# Patient Record
Sex: Male | Born: 1955 | Race: White | Hispanic: No | Marital: Married | Smoking: Former smoker
Health system: Southern US, Community
[De-identification: ages and names within clinical notes are randomized; demographics above are authoritative.]

## PROBLEM LIST (undated history)

## (undated) DIAGNOSIS — C61 Malignant neoplasm of prostate: Secondary | ICD-10-CM

## (undated) HISTORY — PX: VASECTOMY: SHX75

## (undated) HISTORY — PX: PROSTATE BIOPSY: SHX241

---

## 1998-10-02 ENCOUNTER — Emergency Department (HOSPITAL_COMMUNITY): Admission: EM | Admit: 1998-10-02 | Discharge: 1998-10-02 | Payer: Self-pay | Admitting: Emergency Medicine

## 2009-06-16 ENCOUNTER — Ambulatory Visit: Payer: Self-pay | Admitting: Family Medicine

## 2009-06-16 DIAGNOSIS — N529 Male erectile dysfunction, unspecified: Secondary | ICD-10-CM | POA: Insufficient documentation

## 2009-06-16 DIAGNOSIS — R634 Abnormal weight loss: Secondary | ICD-10-CM | POA: Insufficient documentation

## 2009-06-16 LAB — CONVERTED CEMR LAB
Bilirubin Urine: NEGATIVE
Glucose, Urine, Semiquant: NEGATIVE
Specific Gravity, Urine: 1.015
WBC Urine, dipstick: NEGATIVE
pH: 7

## 2009-06-17 LAB — CONVERTED CEMR LAB
ALT: 26 units/L (ref 0–53)
AST: 27 units/L (ref 0–37)
Alkaline Phosphatase: 77 units/L (ref 39–117)
Basophils Relative: 0.5 % (ref 0.0–3.0)
Bilirubin, Direct: 0.2 mg/dL (ref 0.0–0.3)
Calcium: 9.1 mg/dL (ref 8.4–10.5)
Creatinine, Ser: 1.1 mg/dL (ref 0.4–1.5)
Eosinophils Absolute: 0.2 10*3/uL (ref 0.0–0.7)
Eosinophils Relative: 2.7 % (ref 0.0–5.0)
GFR calc non Af Amer: 78.07 mL/min (ref >60)
Lymphocytes Relative: 26.7 % (ref 12.0–46.0)
Monocytes Relative: 6.1 % (ref 3.0–12.0)
Neutrophils Relative %: 64 % (ref 43.0–77.0)
RBC: 4.79 M/uL (ref 4.22–5.81)
Sodium: 143 meq/L (ref 135–145)
Total Protein: 6.9 g/dL (ref 6.0–8.3)
WBC: 5.7 10*3/uL (ref 4.5–10.5)

## 2009-07-06 ENCOUNTER — Ambulatory Visit: Payer: Self-pay | Admitting: Family Medicine

## 2009-07-06 LAB — CONVERTED CEMR LAB: OCCULT 1: NEGATIVE

## 2009-07-22 ENCOUNTER — Ambulatory Visit: Payer: Self-pay | Admitting: Family Medicine

## 2009-10-21 ENCOUNTER — Ambulatory Visit: Payer: Self-pay | Admitting: Family Medicine

## 2009-10-24 ENCOUNTER — Telehealth: Payer: Self-pay | Admitting: Family Medicine

## 2009-10-24 LAB — CONVERTED CEMR LAB: PSA: 3.69 ng/mL (ref 0.10–4.00)

## 2010-01-31 NOTE — Assessment & Plan Note (Signed)
Summary: 1 month fup//ccm   Vital Signs:  Patient profile:   54 year old male Weight:      151 pounds Temp:     98 degrees F oral BP sitting:   120 / 80  (left arm) Cuff size:   regular  Vitals Entered By: Sid Falcon LPN (July 22, 2009 1:23 PM)  History of Present Illness: Recent weight loss. Labs were unremarkable with the exception of PSA 3.35 which is high for his age. His exam is nonfocal. He has gained about 6 pounds since last visit.  Patient brings in 3 day food diary since last visit. Significant caffeine consumption. Increase processed foods. Very few servings of fruits and vegetables. Low intake of calcium and dairy products. High processed sugar intake and high sodium foods. We did not calculate calorie consumption from his food diary yet.  He does appear to be getting adequate amount of calories but overall very poor food choices.  He is exercising more and overall feels well.  Allergies (verified): No Known Drug Allergies  Past History:  Past Medical History: Last updated: 06/16/2009 Chicken pox  Family History: Last updated: 06/16/2009 Family History of Alcoholism/Addiction, 2 brothers Father, heart disease, cholersterol problems hypertension 65s CAD Mother, arthritis, hypertension  Social History: Last updated: 06/16/2009 Occupation:  Art gallery manager Married Alcohol use-no Regular exercise-no Former Smoker quit 1986  Risk Factors: Exercise: no (06/16/2009)  Risk Factors: Smoking Status: quit (06/16/2009) PMH-FH-SH reviewed for relevance  Review of Systems  The patient denies anorexia, fever, chest pain, syncope, dyspnea on exertion, prolonged cough, headaches, hemoptysis, abdominal pain, melena, hematochezia, suspicious skin lesions, and enlarged lymph nodes.    Physical Exam  General:  Well-developed,well-nourished,in no acute distress; alert,appropriate and cooperative throughout examination Head:  Normocephalic and atraumatic without obvious  abnormalities. No apparent alopecia or balding. Ears:  External ear exam shows no significant lesions or deformities.  Otoscopic examination reveals clear canals, tympanic membranes are intact bilaterally without bulging, retraction, inflammation or discharge. Hearing is grossly normal bilaterally. Mouth:  Oral mucosa and oropharynx without lesions or exudates.  Teeth in good repair. Neck:  No deformities, masses, or tenderness noted. Lungs:  Normal respiratory effort, chest expands symmetrically. Lungs are clear to auscultation, no crackles or wheezes. Heart:  Normal rate and regular rhythm. S1 and S2 normal without gallop, murmur, click, rub or other extra sounds. Neurologic:  alert & oriented X3 and cranial nerves II-XII intact.   Skin:  no rashes and no suspicious lesions.   Cervical Nodes:  No lymphadenopathy noted   Impression & Recommendations:  Problem # 1:  WEIGHT LOSS (ICD-783.21) Assessment Improved  good weight gain since last visit. Does have high normal PSA and will recommend repeat in 3 months. Counseled regarding diet-recommend fewer processed foods, reduced sodium, and more fruits and vegetables.  Complete Medication List: 1)  Levitra 20 Mg Tabs (Vardenafil hcl) .... One by mouth once daily as needed  Patient Instructions: 1)  followup in 3 months for repeat PSA 2)  Eat fewer processed foods 3)  Eat more fruits and vegetables. 4)  Increase lean proteins-eggs, chicken, [place]. 5)  Increase non-meat proteins-peanut butter, beans, low fat dairy.

## 2010-01-31 NOTE — Progress Notes (Signed)
Summary: question about PSA  Phone Note Call from Patient Call back at Home Phone [phone]   Caller: Patient Call For: Evelena Peat MD Summary of Call: gave pt lab results and he wants to know why you want to wait .  Wants to know if he can repeat sooner or if you know what is going with PSA Initial call taken by: Alfred Levins, CMA,  October 24, 2009 3:24 PM  Follow-up for Phone Call        He is still within normal range, although high end of normal.  We have option of repeat in 6 months (any sooner is not very helpful). Will be happy to go ahead and refer to urology if he is interested-although they may elect to follow for now. Follow-up by: Evelena Peat MD,  October 25, 2009 5:51 AM  Additional Follow-up for Phone Call Additional follow up Details #1::        Pt will research, and let us know what he would like to do. Additional Follow-up by: Lynann Beaver CMA AAMA,  October 25, 2009 8:49 AM

## 2010-01-31 NOTE — Assessment & Plan Note (Signed)
Summary: NEW PT TO [facility]/TO EST/CJR   Vital Signs:  Patient profile:   55 year old male Height:      71.25 inches Weight:      144.5 pounds BMI:     20.08 Temp:     98.4 degrees F oral Pulse rate:   60 / minute Pulse rhythm:   regular Resp:     12 per minute BP sitting:   120 / 80  (left arm) Cuff size:   regular  Vitals Entered By: Sid Falcon LPN (June 16, 2009 10:27 AM) CC: New to establish   History of Present Illness: New to establish care.  PMH reviewed and no chronic med problems.  No meds.  NKDA. Occ problems with ED and takes Levitra for that.  major issue is weight loss of about 10 pounds over the past year.  Eats 5-6 times per day with good appetite.  Denies headaches, fever, dysphagia, abd pain , dysuria, rashes, cough, dyspnea, change in stool habits, diarrhea, or depressive symptoms.  patient denies prior history of screening colonoscopy. 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 Worsened with fine motor activity. No progressive symptoms.   Preventive Screening-Counseling & Management  Alcohol-Tobacco     Smoking Status: quit     Year Started: 1976     Year Quit: 1986  Caffeine-Diet-Exercise     Does Patient Exercise: no  Past History:  Family History: Last updated: 06/16/2009 Family History of Alcoholism/Addiction, 2 brothers Father, heart disease, cholersterol problems hypertension 27s CAD Mother, arthritis, hypertension  Social History: Last updated: 06/16/2009 Occupation:  Art gallery manager Married Alcohol use-no Regular exercise-no Former Smoker quit 1986  Risk Factors: Exercise: no (06/16/2009)  Risk Factors: Smoking Status: quit (06/16/2009)  Past Medical History: Chicken pox PMH-FH-SH reviewed for relevance  Family History: Family History of Alcoholism/Addiction, 2 brothers Father, heart disease, cholersterol problems hypertension 38s CAD Mother, arthritis, hypertension  Social History: Occupation:   Art gallery manager Married Alcohol use-no Regular exercise-no Former Smoker quit 1986 Smoking Status:  quit Occupation:  employed Does Patient Exercise:  no  Review of Systems       The patient complains of weight loss.  The patient denies anorexia, fever, chest pain, syncope, dyspnea on exertion, peripheral edema, prolonged cough, headaches, hemoptysis, abdominal pain, melena, hematochezia, severe indigestion/heartburn, hematuria, incontinence, genital sores, muscle weakness, suspicious skin lesions, depression, enlarged lymph nodes, and testicular masses.    Physical Exam  General:  Well-developed,well-nourished,in no acute distress; alert,appropriate and cooperative throughout examination Head:  Normocephalic and atraumatic without obvious abnormalities. No apparent alopecia or balding. Eyes:  No corneal or conjunctival inflammation noted. EOMI. Perrla. Funduscopic exam benign, without hemorrhages, exudates or papilledema. Vision grossly normal. Ears:  External ear exam shows no significant lesions or deformities.  Otoscopic examination reveals clear canals, tympanic membranes are intact bilaterally without bulging, retraction, inflammation or discharge. Hearing is grossly normal bilaterally. Mouth:  Oral mucosa and oropharynx without lesions or exudates.  Teeth in good repair. Neck:  No deformities, masses, or tenderness noted. Lungs:  Normal respiratory effort, chest expands symmetrically. Lungs are clear to auscultation, no crackles or wheezes. Heart:  Normal rate and regular rhythm. S1 and S2 normal without gallop, murmur, click, rub or other extra sounds. Abdomen:  Bowel sounds positive,abdomen soft and non-tender without masses, organomegaly or hernias noted. Rectal:  No external abnormalities noted. Normal sphincter tone. No rectal masses or tenderness. Prostate:  Prostate gland firm and smooth, no enlargement, nodularity, tenderness, mass, asymmetry or induration. Msk:  No deformity or  scoliosis noted of thoracic or lumbar spine.   Extremities:  No clubbing, cyanosis, edema, or deformity noted with normal full range of motion of all joints.   Neurologic:  alert & oriented X3 and cranial nerves II-XII intact.   Skin:  no rashes and no suspicious lesions.   Cervical Nodes:  No lymphadenopathy noted Psych:  Oriented X3, good eye contact, not anxious appearing, and not depressed appearing.     Impression & Recommendations:  Problem # 1:  WEIGHT LOSS (ICD-783.21) ?etiology.  needs screening labs and close follow up one month.  no evidence for depression.  If labs normal and weight loss progresses, consider CXR and ?abd imaging at follow up. 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 One by mouth once daily as needed  Complete Medication List: 1)  Levitra 20 Mg Tabs (Vardenafil hcl) .... One by mouth once daily as needed  Patient Instructions: 1)  Please schedule a follow-up appointment in 1 month.   Preventive Care Screening  Last Tetanus Booster:    Date:  01/02/2003    Results:  Historical    Laboratory Results   Urine Tests  Date/Time Recieved: June 16, 2009 12:01 PM  Date/Time Reported: June 16, 2009 12:00 PM   Routine Urinalysis   Color: yellow Appearance: Clear Glucose: negative   (Normal Range: Negative) Bilirubin: negative   (Normal Range: Negative) Ketone: negative   (Normal Range: Negative) Spec. Gravity: 1.015   (Normal Range: 1.003-1.035) Blood: negative   (Normal Range: Negative) pH: 7.0   (Normal Range:  5.0-8.0) Protein: negative   (Normal Range: Negative) Urobilinogen: 0.2   (Normal Range: 0-1) Nitrite: negative   (Normal Range: Negative) Leukocyte Esterace: negative   (Normal Range: Negative)    Comments: Wynona Canes, CMA  June 16, 2009 12:01 PM

## 2010-04-27 ENCOUNTER — Ambulatory Visit (INDEPENDENT_AMBULATORY_CARE_PROVIDER_SITE_OTHER): Payer: BC Managed Care – PPO | Admitting: Family Medicine

## 2010-04-27 ENCOUNTER — Encounter: Payer: Self-pay | Admitting: Family Medicine

## 2010-04-27 VITALS — BP 104/66 | HR 98 | Temp 100.0°F | Ht 72.0 in | Wt 148.0 lb

## 2010-04-27 DIAGNOSIS — R509 Fever, unspecified: Secondary | ICD-10-CM

## 2010-04-27 NOTE — Patient Instructions (Signed)
Continue with Ibuprofen for fever and plenty of fluids. Follow up promptly for any worsening symptoms.

## 2010-04-27 NOTE — Progress Notes (Signed)
  Subjective:    Patient ID: Isaac Ramsey, male    DOB: 17-Jun-1955, 55 y.o.   MRN: [id number]  HPI Patient seen with low-grade fever, headaches, and chills. Onset this morning. Recent history is that he states he had gum abscess about 2 weeks ago. Was treated with Avelox and eventually symptoms improved. Finished antibiotic last week. Had one loose stool today. No bloody stools. No abnormal cramps. He's had some bifrontal headache and some sinus pressure. Mild sore throat. Diffuse body aches. Denies any cough. No recent tick bite. No localizing abdominal pain. No dysuria. Body aches and fever improved with ibuprofen   Review of Systems As per history of present illness    Objective:   Physical Exam  Constitutional: He appears well-developed and well-nourished. No distress.  HENT:  Right Ear: External ear normal.  Left Ear: External ear normal.  Mouth/Throat: Oropharynx is clear and moist. No oropharyngeal exudate.  Eyes: Pupils are equal, round, and reactive to light.  Neck: Normal range of motion. Neck supple.  Cardiovascular: Normal rate, regular rhythm and normal heart sounds.   No murmur heard. Pulmonary/Chest: Effort normal and breath sounds normal. No respiratory distress. He has no wheezes. He has no rales.  Abdominal: Soft. Bowel sounds are normal. He exhibits no distension and no mass. There is no tenderness. There is no rebound and no guarding.  Musculoskeletal: He exhibits no edema.  Lymphadenopathy:    He has no cervical adenopathy.  Skin: No rash noted.          Assessment & Plan:  Febrile illness. Suspect viral. Continue ibuprofen including fluids. Follow up promptly for increasing fever or worsening symptoms

## 2010-04-28 ENCOUNTER — Telehealth: Payer: Self-pay

## 2010-04-28 NOTE — Telephone Encounter (Signed)
Pt called and stated that he was seen on 04/27/2010 and he got worse over the night; still has a headache, experiencing bad diarrhea and stomach cramping; would like an rx for diarrhea because he cannot keep any fluids down---please advise

## 2010-04-28 NOTE — Telephone Encounter (Signed)
Try Imodium which is over the counter.

## 2010-04-28 NOTE — Telephone Encounter (Signed)
Spoke with pt; he is aware that Immodium is best for diarrhea at this point and Tylenol for his headache

## 2010-05-12 ENCOUNTER — Ambulatory Visit (INDEPENDENT_AMBULATORY_CARE_PROVIDER_SITE_OTHER): Payer: BC Managed Care – PPO | Admitting: Family Medicine

## 2010-05-12 ENCOUNTER — Encounter: Payer: Self-pay | Admitting: Family Medicine

## 2010-05-12 VITALS — BP 110/74 | Temp 98.4°F | Wt 144.0 lb

## 2010-05-12 DIAGNOSIS — R42 Dizziness and giddiness: Secondary | ICD-10-CM

## 2010-05-12 DIAGNOSIS — R634 Abnormal weight loss: Secondary | ICD-10-CM

## 2010-05-12 NOTE — Progress Notes (Signed)
  Subjective:    Patient ID: Isaac Ramsey, male    DOB: 12-Jul-1955, 55 y.o.   MRN: [id number]  HPI Patient same of dizziness which started about a week ago. Recent probable viral illness with gastrointestinal symptoms and those have finally resolved. He has not had any further diarrhea this time. He complains of some weight loss and weight is down 4 pounds though comparing with weight one year ago was approximate same weight is today. Generally his appetite is okay.  Dizziness sounds more like vertigo. Worse with movement. No hearing changes. No ringing in the ears. Denies any headaches. No fever or chills. No syncopal or presyncopal type symptoms. No orthostatic symptoms. Denies focal weakness. No problems with coordination.  He takes no medications. Hx smoker. Denies any cough or shortness of breath. No stool changes. No dysuria. No skin rashes. Denies abdominal pain.  Did have high normal PSA several months ago and needs repeat.   Review of Systems  Constitutional: Positive for unexpected weight change. Negative for fever, chills, appetite change and fatigue.  HENT: Negative for hearing loss, ear pain, sore throat, trouble swallowing, voice change, sinus pressure, tinnitus and ear discharge.   Respiratory: Negative for cough, shortness of breath and wheezing.   Cardiovascular: Negative for chest pain, palpitations and leg swelling.  Gastrointestinal: Negative for nausea, vomiting, abdominal pain, diarrhea, constipation and blood in stool.  Genitourinary: Negative for dysuria and hematuria.  Neurological: Positive for dizziness. Negative for seizures, syncope, facial asymmetry, weakness and headaches.  Hematological: Negative for adenopathy. Does not bruise/bleed easily.  Psychiatric/Behavioral: Negative for dysphoric mood.       Objective:   Physical Exam  Constitutional: He is oriented to person, place, and time. He appears well-developed and well-nourished.  HENT:  Head:  Normocephalic.  Right Ear: External ear normal.  Left Ear: External ear normal.  Mouth/Throat: Oropharynx is clear and moist. No oropharyngeal exudate.  Eyes: Pupils are equal, round, and reactive to light.  Neck: Neck supple. No thyromegaly present.  Cardiovascular: Normal rate, regular rhythm and normal heart sounds.   No murmur heard. Pulmonary/Chest: Effort normal and breath sounds normal. No respiratory distress. He has no wheezes. He has no rales.  Musculoskeletal: He exhibits no edema.  Lymphadenopathy:    He has no cervical adenopathy.  Neurological: He is alert and oriented to person, place, and time. He has normal reflexes. No cranial nerve deficit.       Cerebellar function is normal. No focal strength deficits  Skin: No rash noted.  Psychiatric: He has a normal mood and affect.          Assessment & Plan:  #1 dizziness. This sounds more like vertigo. Suspect benign positional vertigo. Nonfocal exam at this time. Reassurance and observation. #2 weight loss which is relatively mild. Recheck labs including TSH, PSA, CBC, basic metabolic panel, and hepatic panel. #3 history of high normal PSA. Repeat today and if still elevating urology referral

## 2010-05-12 NOTE — Patient Instructions (Signed)
Benign Positional Vertigo (BPV)  Vertigo is a feeling that you are unsteady or that you or your surroundings are moving. Benign positional vertigo (BPV) is the most common form of vertigo. Benign means it does not have a serious cause. It is an upset in the balance system in your middle ear. This is troublesome but usually not serious. A viral infection or head injury are common causes, but often no cause is found. It is more common as we grow older.  SYMPTOMS  Sudden dizziness happens when you move your head in different directions. Some of the problems that come with this are:   Loss of balance    Throwing up      Blurred vision     Dizziness      Feeling sick to your stomach      DIAGNOSIS  Your caregiver may do some specialized testing to prove what is wrong.  HOME CARE INSTRUCTIONS   Rest and eat a well balanced diet.    Move slowly and do not make sudden body or head movements.    Do not drive a car or do any activities that could hurt you or others.    Lie down and rest. Take precautions to prevent falls.   SEEK IMMEDIATE MEDICAL CARE IF:   You develop headaches which are severe or lasting.    You develop continued vomiting.    A temporary loss or change of vision appears.    You notice temporary numbness on one side of your body.    You are temporarily unable to speak.    Temporary areas of weakness develop.    You have weakness or numbness in the face, arms, or legs.    You notice dizziness or difficulty walking.    You experience slurred speech or difficulty swallowing.   MAKE SURE YOU:     Understand these instructions.    Will watch your condition.    Will get help right away if you are not doing well or get worse.   Document Released: 09/25/2005 Document Re-Released: 04/05/2008  [employer] Patient Information 2011 [employer].

## 2010-05-13 LAB — BASIC METABOLIC PANEL
CO2: 30 mEq/L (ref 19–32)
Calcium: 8.4 mg/dL (ref 8.4–10.5)
Sodium: 142 mEq/L (ref 135–145)

## 2010-05-13 LAB — CBC WITH DIFFERENTIAL/PLATELET
Basophils Relative: 1 % (ref 0–1)
Eosinophils Absolute: 0.1 10*3/uL (ref 0.0–0.7)
HCT: 38.7 % — ABNORMAL LOW (ref 39.0–52.0)
Hemoglobin: 12.8 g/dL — ABNORMAL LOW (ref 13.0–17.0)
MCH: 28.6 pg (ref 26.0–34.0)
MCHC: 33.1 g/dL (ref 30.0–36.0)
Monocytes Absolute: 0.5 10*3/uL (ref 0.1–1.0)
Monocytes Relative: 10 % (ref 3–12)
Neutrophils Relative %: 43 % (ref 43–77)

## 2010-05-13 LAB — HEPATIC FUNCTION PANEL
ALT: 25 U/L (ref 0–53)
AST: 25 U/L (ref 0–37)
Albumin: 3.5 g/dL (ref 3.5–5.2)
Total Protein: 6.5 g/dL (ref 6.0–8.3)

## 2010-05-13 LAB — PSA: PSA: 2.36 ng/mL (ref <=4.00)

## 2010-05-16 NOTE — Progress Notes (Signed)
Quick Note:  On VM explained to pt labs and instructions will be in the mail for him to review and call back to schedule ______

## 2010-05-23 ENCOUNTER — Other Ambulatory Visit (INDEPENDENT_AMBULATORY_CARE_PROVIDER_SITE_OTHER): Payer: BC Managed Care – PPO | Admitting: Family Medicine

## 2010-05-23 DIAGNOSIS — D509 Iron deficiency anemia, unspecified: Secondary | ICD-10-CM

## 2010-05-23 DIAGNOSIS — E611 Iron deficiency: Secondary | ICD-10-CM

## 2010-05-24 NOTE — Progress Notes (Signed)
Quick Note:  Pt informed ______ 

## 2010-08-07 ENCOUNTER — Other Ambulatory Visit: Payer: BC Managed Care – PPO

## 2010-08-07 DIAGNOSIS — K921 Melena: Secondary | ICD-10-CM

## 2010-08-07 DIAGNOSIS — Z0000 Encounter for general adult medical examination without abnormal findings: Secondary | ICD-10-CM

## 2010-08-07 LAB — HEMOCCULT GUIAC POC 1CARD (OFFICE): Card #3 Fecal Occult Blood, POC: NEGATIVE

## 2010-08-09 NOTE — Progress Notes (Signed)
Quick Note:  Pt informed ______ 

## 2011-12-27 ENCOUNTER — Encounter: Payer: Self-pay | Admitting: Family Medicine

## 2011-12-27 ENCOUNTER — Other Ambulatory Visit: Payer: Self-pay | Admitting: Family Medicine

## 2011-12-27 ENCOUNTER — Ambulatory Visit (INDEPENDENT_AMBULATORY_CARE_PROVIDER_SITE_OTHER): Payer: BC Managed Care – PPO | Admitting: Family Medicine

## 2011-12-27 VITALS — BP 110/80 | Temp 97.9°F | Wt 150.0 lb

## 2011-12-27 DIAGNOSIS — B351 Tinea unguium: Secondary | ICD-10-CM

## 2011-12-27 LAB — HEPATIC FUNCTION PANEL
Bilirubin, Direct: 0.2 mg/dL (ref 0.0–0.3)
Total Bilirubin: 2.2 mg/dL — ABNORMAL HIGH (ref 0.3–1.2)

## 2011-12-27 MED ORDER — TERBINAFINE HCL 250 MG PO TABS: 250.0000 mg | ORAL_TABLET | Freq: Every day | ORAL | Status: DC

## 2011-12-27 NOTE — Progress Notes (Signed)
  Subjective:    Patient ID: Isaac Ramsey, male    DOB: 1955-08-21, 56 y.o.   MRN: [id number]  HPI  Dysmorphic left great toenail. Patient had some brittle changes in color changes for several months. No history of trauma. Nonpainful. No history of diabetes. He is interested in treatment options. No alleviating factors. No history of any liver problems. Appetite and weight have been stable   Review of Systems  Constitutional: Negative for fever, chills, appetite change and unexpected weight change.  Gastrointestinal: Negative for abdominal pain.       Objective:   Physical Exam  Constitutional: He appears well-developed and well-nourished.  Cardiovascular: Normal rate and regular rhythm.   Pulmonary/Chest: Effort normal and breath sounds normal. No respiratory distress. He has no wheezes. He has no rales.  Skin:          Left great toenail reveals some brittle changes and a whitish discoloration along the base and the lateral border of the toenail. No paronychia. No cellulitis changes. Nontender          Assessment & Plan:  Probable onychomycosis left great toenail. Nail scraping /clippings will be sent for fungal culture. Hepatic panel drawn. Lamisil 250 mg once daily for 3 months

## 2011-12-27 NOTE — Patient Instructions (Addendum)
Ringworm, Nail  A fungal infection of the nail (tinea unguium/onychomycosis) is common. It is common as the visible part of the nail is composed of dead cells which have no blood supply to help prevent infection. It occurs because fungi are everywhere and will pick any opportunity to grow on any dead material.  Because nails are very slow growing they require up to 2 years of treatment with anti-fungal medications. The entire nail back to the base is infected. This includes approximately  of the nail which you cannot see.  If your caregiver has prescribed a medication by mouth, take it every day and as directed. No progress will be seen for at least 6 to 9 months. Do not be disappointed! Because fungi live on dead cells with little or no exposure to blood supply, medication delivery to the infection is slow; thus the cure is slow. It is also why you can observe no progress in the first 6 months. The nail becoming cured is the base of the nail, as it has the blood supply. Topical medication such as creams and ointments are usually not effective. Important in successful treatment of nail fungus is closely following the medication regimen that your doctor prescribes.  Sometimes you and your caregiver may elect to speed up this process by surgical removal of all the nails. Even this may still require 6 to 9 months of additional oral medications.  See your caregiver as directed. Remember there will be no visible improvement for at least 6 months. See your caregiver sooner if other signs of infection (redness and swelling) develop.  Document Released: 12/16/1999 Document Revised: 03/12/2011 Document Reviewed: 02/24/2008  [employer] Patient Information 2013 [employer].

## 2011-12-28 ENCOUNTER — Other Ambulatory Visit: Payer: Self-pay | Admitting: *Deleted

## 2011-12-28 LAB — KOH PREP

## 2012-01-15 NOTE — Progress Notes (Signed)
Quick Note:  Pt informed on VM ______ 

## 2012-01-25 LAB — CULTURE, FUNGUS WITHOUT SMEAR

## 2012-07-18 ENCOUNTER — Other Ambulatory Visit (INDEPENDENT_AMBULATORY_CARE_PROVIDER_SITE_OTHER): Payer: BC Managed Care – PPO

## 2012-07-18 DIAGNOSIS — Z0000 Encounter for general adult medical examination without abnormal findings: Secondary | ICD-10-CM

## 2012-07-18 DIAGNOSIS — R7989 Other specified abnormal findings of blood chemistry: Secondary | ICD-10-CM

## 2012-07-18 LAB — LDL CHOLESTEROL, DIRECT: Direct LDL: 123.2 mg/dL

## 2012-07-18 LAB — POCT URINALYSIS DIPSTICK
Bilirubin, UA: NEGATIVE
Blood, UA: NEGATIVE
Glucose, UA: NEGATIVE
Ketones, UA: NEGATIVE
Leukocytes, UA: NEGATIVE
Nitrite, UA: NEGATIVE
Protein, UA: NEGATIVE
Spec Grav, UA: 1.02
Urobilinogen, UA: 0.2
pH, UA: 8.5

## 2012-07-18 LAB — BASIC METABOLIC PANEL
BUN: 10 mg/dL (ref 6–23)
Chloride: 101 mEq/L (ref 96–112)
Glucose, Bld: 88 mg/dL (ref 70–99)
Potassium: 4.4 mEq/L (ref 3.5–5.1)

## 2012-07-18 LAB — LIPID PANEL
Cholesterol: 202 mg/dL — ABNORMAL HIGH (ref 0–200)
HDL: 54.9 mg/dL (ref >39.00)
Total CHOL/HDL Ratio: 4
Triglycerides: 100 mg/dL (ref 0.0–149.0)
VLDL: 20 mg/dL (ref 0.0–40.0)

## 2012-07-18 LAB — CBC WITH DIFFERENTIAL/PLATELET
Basophils Relative: 0.6 % (ref 0.0–3.0)
Eosinophils Relative: 4.7 % (ref 0.0–5.0)
Hemoglobin: 15 g/dL (ref 13.0–17.0)
Lymphocytes Relative: 31.2 % (ref 12.0–46.0)
MCV: 90.7 fl (ref 78.0–100.0)
Neutrophils Relative %: 56.2 % (ref 43.0–77.0)
RBC: 4.88 Mil/uL (ref 4.22–5.81)
WBC: 4.9 10*3/uL (ref 4.5–10.5)

## 2012-07-18 LAB — HEPATIC FUNCTION PANEL
ALT: 23 U/L (ref 0–53)
Total Bilirubin: 2 mg/dL — ABNORMAL HIGH (ref 0.3–1.2)
Total Protein: 6.7 g/dL (ref 6.0–8.3)

## 2012-07-25 ENCOUNTER — Ambulatory Visit (INDEPENDENT_AMBULATORY_CARE_PROVIDER_SITE_OTHER): Payer: BC Managed Care – PPO | Admitting: Family Medicine

## 2012-07-25 ENCOUNTER — Encounter: Payer: Self-pay | Admitting: Family Medicine

## 2012-07-25 VITALS — BP 110/74 | HR 81 | Temp 98.2°F | Ht 72.0 in | Wt 150.0 lb

## 2012-07-25 DIAGNOSIS — Z0000 Encounter for general adult medical examination without abnormal findings: Secondary | ICD-10-CM

## 2012-07-25 DIAGNOSIS — G250 Essential tremor: Secondary | ICD-10-CM

## 2012-07-25 DIAGNOSIS — G252 Other specified forms of tremor: Secondary | ICD-10-CM

## 2012-07-25 DIAGNOSIS — R972 Elevated prostate specific antigen [PSA]: Secondary | ICD-10-CM

## 2012-07-25 NOTE — Progress Notes (Signed)
Subjective:    Patient ID: Isaac Ramsey, male    DOB: September 11, 1955, 57 y.o.   MRN: [id number]  HPI Patient here for complete physical. Generally very healthy. He has history of essential tremor which is minimally bothersome and is not interested in medication this time. He has never had screening colonoscopy and is not willing to go at this time. He does agree to Hemoccult cards. Last tetanus 2005. He walks some for exercise but no consistent exercise. Takes no medications. No prior surgeries.  Patient quit smoking several years ago. Family history as below  Patient has history of essential tremor. Symptoms are relatively mild. Worse with activity. Not extinguished with activities. He has not had any new findings and minimal if any progression since last visit. He is not interested in medication treatment options at this time. He has tried to minimize his caffeine use. Caffeine seems to exacerbate  Recent very transient vertigo episode. He had 2 episodes over . Both occurrences were with turning head to the left rapidly. He had no headaches, visual changes, speech changes, ataxia, or a other focal neurologic symptoms. No syncopal or presyncopal symptoms  Past Medical History  Diagnosis Date  . Chickenpox    No past surgical history on file.  reports that he quit smoking about 28 years ago. He does not have any smokeless tobacco history on file. He reports that he does not drink alcohol. His drug history is not on file. family history includes Alcohol abuse in his brother; Arthritis in his mother; Heart disease (age of onset: 62) in his father; Hyperlipidemia in his father; and Hypertension in his father and mother. No Known Allergies    Review of Systems  Constitutional: Negative for fever, chills, activity change, appetite change and fatigue.  HENT: Negative for ear pain, congestion and trouble swallowing.   Eyes: Negative for pain and visual disturbance.  Respiratory: Negative for  cough, shortness of breath and wheezing.   Cardiovascular: Negative for chest pain and palpitations.  Gastrointestinal: Negative for nausea, vomiting, abdominal pain, diarrhea, constipation, blood in stool, abdominal distention and rectal pain.  Genitourinary: Negative for dysuria, hematuria and testicular pain.  Musculoskeletal: Negative for joint swelling and arthralgias.  Skin: Negative for rash.  Neurological: Negative for dizziness, syncope and headaches.  Hematological: Negative for adenopathy.  Psychiatric/Behavioral: Negative for confusion and dysphoric mood.       Objective:   Physical Exam  Constitutional: He is oriented to person, place, and time. He appears well-developed and well-nourished. No distress.  HENT:  Head: Normocephalic and atraumatic.  Right Ear: External ear normal.  Left Ear: External ear normal.  Mouth/Throat: Oropharynx is clear and moist.  Eyes: Conjunctivae and EOM are normal. Pupils are equal, round, and reactive to light.  Neck: Normal range of motion. Neck supple. No thyromegaly present.  Cardiovascular: Normal rate, regular rhythm and normal heart sounds.   No murmur heard. Pulmonary/Chest: No respiratory distress. He has no wheezes. He has no rales.  Abdominal: Soft. Bowel sounds are normal. He exhibits no distension and no mass. There is no tenderness. There is no rebound and no guarding.  Genitourinary: Rectum normal and prostate normal.  Musculoskeletal: He exhibits no edema.  Lymphadenopathy:    He has no cervical adenopathy.  Neurological: He is alert and oriented to person, place, and time. He displays normal reflexes. No cranial nerve deficit.  Skin: No rash noted.  Psychiatric: He has a normal mood and affect.  Assessment & Plan:  Complete physical. Patient has elevated PSA 5.36. Set of urology referral. Hemoccult cards given. he declines colonoscopy at this time.   We'll need tetanus booster next year  Essential tremor. We  discussed possible medication options but at this point he does not wish to pursue those.  Very transient vertigo recently. Symptoms resolved at this time. Suspect benign positional vertigo. Reassurance.

## 2012-07-25 NOTE — Patient Instructions (Addendum)
We will call you with Urology appointment. 

## 2013-02-02 ENCOUNTER — Other Ambulatory Visit (INDEPENDENT_AMBULATORY_CARE_PROVIDER_SITE_OTHER): Payer: BC Managed Care – PPO

## 2013-02-02 ENCOUNTER — Telehealth: Payer: Self-pay

## 2013-02-02 DIAGNOSIS — R972 Elevated prostate specific antigen [PSA]: Secondary | ICD-10-CM

## 2013-02-02 LAB — PSA: PSA: 7.6 ng/mL — AB (ref 0.10–4.00)

## 2013-02-02 NOTE — Telephone Encounter (Signed)
Pt wants to know if he can get his PSA checked

## 2013-02-02 NOTE — Telephone Encounter (Signed)
Order is placed.

## 2013-02-02 NOTE — Telephone Encounter (Signed)
Yes OK to order 

## 2013-09-02 ENCOUNTER — Other Ambulatory Visit (INDEPENDENT_AMBULATORY_CARE_PROVIDER_SITE_OTHER): Payer: BC Managed Care – PPO

## 2013-09-02 DIAGNOSIS — Z0000 Encounter for general adult medical examination without abnormal findings: Secondary | ICD-10-CM

## 2013-09-02 LAB — POCT URINALYSIS DIPSTICK
BILIRUBIN UA: NEGATIVE
Blood, UA: NEGATIVE
GLUCOSE UA: NEGATIVE
KETONES UA: NEGATIVE
Leukocytes, UA: NEGATIVE
Nitrite, UA: NEGATIVE
Protein, UA: NEGATIVE
SPEC GRAV UA: 1.01
Urobilinogen, UA: 0.2
pH, UA: 7

## 2013-09-02 LAB — CBC WITH DIFFERENTIAL/PLATELET
Basophils Absolute: 0 10*3/uL (ref 0.0–0.1)
Basophils Relative: 0.6 % (ref 0.0–3.0)
Eosinophils Absolute: 0.2 10*3/uL (ref 0.0–0.7)
Eosinophils Relative: 3.5 % (ref 0.0–5.0)
HCT: 43.7 % (ref 39.0–52.0)
HEMOGLOBIN: 14.8 g/dL (ref 13.0–17.0)
Lymphocytes Relative: 32.6 % (ref 12.0–46.0)
Lymphs Abs: 1.5 10*3/uL (ref 0.7–4.0)
MCHC: 34 g/dL (ref 30.0–36.0)
MCV: 89.7 fl (ref 78.0–100.0)
Monocytes Absolute: 0.3 10*3/uL (ref 0.1–1.0)
Monocytes Relative: 7.6 % (ref 3.0–12.0)
NEUTROS ABS: 2.5 10*3/uL (ref 1.4–7.7)
Neutrophils Relative %: 55.7 % (ref 43.0–77.0)
Platelets: 209 10*3/uL (ref 150.0–400.0)
RBC: 4.87 Mil/uL (ref 4.22–5.81)
RDW: 12.7 % (ref 11.5–15.5)
WBC: 4.5 10*3/uL (ref 4.0–10.5)

## 2013-09-02 LAB — LIPID PANEL
CHOLESTEROL: 216 mg/dL — AB (ref 0–200)
HDL: 53.4 mg/dL (ref >39.00)
LDL Cholesterol: 135 mg/dL — ABNORMAL HIGH (ref 0–99)
NonHDL: 162.6
TRIGLYCERIDES: 137 mg/dL (ref 0.0–149.0)
Total CHOL/HDL Ratio: 4
VLDL: 27.4 mg/dL (ref 0.0–40.0)

## 2013-09-02 LAB — HEPATIC FUNCTION PANEL
ALBUMIN: 3.8 g/dL (ref 3.5–5.2)
ALK PHOS: 56 U/L (ref 39–117)
ALT: 22 U/L (ref 0–53)
AST: 23 U/L (ref 0–37)
BILIRUBIN TOTAL: 1.5 mg/dL — AB (ref 0.2–1.2)
Bilirubin, Direct: 0.2 mg/dL (ref 0.0–0.3)
Total Protein: 6.8 g/dL (ref 6.0–8.3)

## 2013-09-02 LAB — BASIC METABOLIC PANEL
BUN: 13 mg/dL (ref 6–23)
CO2: 30 mEq/L (ref 19–32)
Calcium: 9.4 mg/dL (ref 8.4–10.5)
Chloride: 102 mEq/L (ref 96–112)
Creatinine, Ser: 1.1 mg/dL (ref 0.4–1.5)
GFR: 74.44 mL/min (ref >60.00)
GLUCOSE: 90 mg/dL (ref 70–99)
Potassium: 4.4 mEq/L (ref 3.5–5.1)
Sodium: 138 mEq/L (ref 135–145)

## 2013-09-02 LAB — TSH: TSH: 2.38 u[IU]/mL (ref 0.35–4.50)

## 2013-09-02 LAB — PSA: PSA: 8.44 ng/mL — ABNORMAL HIGH (ref 0.10–4.00)

## 2013-09-09 ENCOUNTER — Encounter: Payer: Self-pay | Admitting: Family Medicine

## 2013-09-09 ENCOUNTER — Ambulatory Visit (INDEPENDENT_AMBULATORY_CARE_PROVIDER_SITE_OTHER): Payer: BC Managed Care – PPO | Admitting: Family Medicine

## 2013-09-09 VITALS — BP 110/68 | HR 66 | Temp 98.1°F | Ht 72.0 in | Wt 150.0 lb

## 2013-09-09 DIAGNOSIS — Z0000 Encounter for general adult medical examination without abnormal findings: Secondary | ICD-10-CM

## 2013-09-09 DIAGNOSIS — Z23 Encounter for immunization: Secondary | ICD-10-CM

## 2013-09-09 NOTE — Progress Notes (Signed)
   Subjective:    Patient ID: Isaac Ramsey, male    DOB: 01-31-1955, 58 y.o.   MRN: [id number]  HPI Patient seen for complete physical. Elevating PSA over the past year had biopsies which confirmed prostate cancer. Apparently, his Gleason score was 6. He has not decided whether to pursue prostatectomy at this time. Last tetanus 10 years ago. Declines flu vaccine. Takes no regular medications. Nonsmoker. No consistent exercise. Father had CAD in his 23s but was a heavy smoker.  Past Medical History  Diagnosis Date  . Chickenpox    No past surgical history on file.  reports that he quit smoking about 29 years ago. He does not have any smokeless tobacco history on file. He reports that he does not drink alcohol. His drug history is not on file. family history includes Alcohol abuse in his brother; Arthritis in his mother; Heart disease (age of onset: 87) in his father; Hyperlipidemia in his father; Hypertension in his father and mother. No Known Allergies    Review of Systems  Constitutional: Negative for fever, activity change, appetite change and fatigue.  HENT: Negative for congestion, ear pain and trouble swallowing.   Eyes: Negative for pain and visual disturbance.  Respiratory: Negative for cough, shortness of breath and wheezing.   Cardiovascular: Negative for chest pain and palpitations.  Gastrointestinal: Negative for nausea, vomiting, abdominal pain, diarrhea, constipation, blood in stool, abdominal distention and rectal pain.  Genitourinary: Negative for dysuria, hematuria and testicular pain.  Musculoskeletal: Negative for arthralgias and joint swelling.  Skin: Negative for rash.  Neurological: Negative for dizziness, syncope and headaches.  Hematological: Negative for adenopathy.  Psychiatric/Behavioral: Negative for confusion and dysphoric mood.       Objective:   Physical Exam  Constitutional: He is oriented to person, place, and time. He appears well-developed and  well-nourished. No distress.  HENT:  Head: Normocephalic and atraumatic.  Right Ear: External ear normal.  Left Ear: External ear normal.  Mouth/Throat: Oropharynx is clear and moist.  Eyes: Conjunctivae and EOM are normal. Pupils are equal, round, and reactive to light.  Neck: Normal range of motion. Neck supple. No thyromegaly present.  Cardiovascular: Normal rate, regular rhythm and normal heart sounds.   No murmur heard. Pulmonary/Chest: No respiratory distress. He has no wheezes. He has no rales.  Abdominal: Soft. Bowel sounds are normal. He exhibits no distension and no mass. There is no tenderness. There is no rebound and no guarding.  Genitourinary:  Per urology  Musculoskeletal: He exhibits no edema.  Lymphadenopathy:    He has no cervical adenopathy.  Neurological: He is alert and oriented to person, place, and time. He displays normal reflexes. No cranial nerve deficit.  Skin: No rash noted.  Psychiatric: He has a normal mood and affect.          Assessment & Plan:  Complete physical. Prostate cancer. Follow up closely with urology. Tetanus booster given. Flu vaccine offered and declined. Labs reviewed with no major concerns.

## 2013-09-09 NOTE — Progress Notes (Signed)
Pre visit review using our clinic review tool, if applicable. No additional management support is needed unless otherwise documented below in the visit note. 

## 2014-01-29 ENCOUNTER — Other Ambulatory Visit: Payer: Self-pay | Admitting: Urology

## 2014-01-29 DIAGNOSIS — C61 Malignant neoplasm of prostate: Secondary | ICD-10-CM

## 2014-02-09 ENCOUNTER — Ambulatory Visit (INDEPENDENT_AMBULATORY_CARE_PROVIDER_SITE_OTHER): Payer: BLUE CROSS/BLUE SHIELD | Admitting: Family Medicine

## 2014-02-09 ENCOUNTER — Encounter: Payer: Self-pay | Admitting: Family Medicine

## 2014-02-09 VITALS — BP 108/80 | HR 70 | Temp 97.9°F | Wt 147.0 lb

## 2014-02-09 DIAGNOSIS — H109 Unspecified conjunctivitis: Secondary | ICD-10-CM

## 2014-02-09 MED ORDER — POLYMYXIN B-TRIMETHOPRIM 10000-0.1 UNIT/ML-% OP SOLN: 2.0000 [drp] | OPHTHALMIC | Status: DC

## 2014-02-09 NOTE — Progress Notes (Signed)
Pre visit review using our clinic review tool, if applicable. No additional management support is needed unless otherwise documented below in the visit note. 

## 2014-02-09 NOTE — Patient Instructions (Signed)

## 2014-02-09 NOTE — Progress Notes (Signed)
   Subjective:    Patient ID: Isaac Ramsey, male    DOB: 10/03/1955, 59 y.o.   MRN: [id number]  HPI  Patient seen with three-week history of some intermittent bilateral eye symptoms. He's noted some watering but also some early-morning crusting and matting. He's not had any true blurred vision. No eye pain. Minimal itching. No changes in visual acuity. No headaches. No sinus congestive symptoms. No contact use.  Past Medical History  Diagnosis Date  . Chickenpox    No past surgical history on file.  reports that he quit smoking about 30 years ago. He does not have any smokeless tobacco history on file. He reports that he does not drink alcohol. His drug history is not on file. family history includes Alcohol abuse in his brother; Arthritis in his mother; Heart disease (age of onset: 80) in his father; Hyperlipidemia in his father; Hypertension in his father and mother. No Known Allergies   Review of Systems  Constitutional: Negative for fever and chills.  Eyes: Positive for discharge. Negative for photophobia, pain and visual disturbance.  Neurological: Negative for headaches.       Objective:   Physical Exam  Constitutional: He appears well-developed.  Eyes: Pupils are equal, round, and reactive to light.  Only minimal bilateral conjunctiva erythema.  No purulent secretions at this time. Pupils equal round reactive to light. Cornea appears normal bilaterally.          Assessment & Plan:  Bilateral eye irritation. By description most likely resolving bacterial conjunctivitis. Continue warm compresses. Polytrim ophthalmic drops 2 drops each every 4 hours while awake. He has scheduled eye exam next week with optometrist

## 2014-02-24 ENCOUNTER — Ambulatory Visit (HOSPITAL_COMMUNITY)
Admission: RE | Admit: 2014-02-24 | Discharge: 2014-02-24 | Disposition: A | Discharge status: Home / Self Care | Payer: BLUE CROSS/BLUE SHIELD | Source: Ambulatory Visit | Attending: Urology | Admitting: Urology

## 2014-02-24 DIAGNOSIS — Z8546 Personal history of malignant neoplasm of prostate: Secondary | ICD-10-CM | POA: Diagnosis not present

## 2014-02-24 DIAGNOSIS — N400 Enlarged prostate without lower urinary tract symptoms: Secondary | ICD-10-CM | POA: Diagnosis not present

## 2014-02-24 DIAGNOSIS — C61 Malignant neoplasm of prostate: Secondary | ICD-10-CM

## 2014-02-24 MED ORDER — GADOBENATE DIMEGLUMINE 529 MG/ML IV SOLN
15.0000 mL | Freq: Once | INTRAVENOUS | Status: AC | PRN
  Administered 2014-02-24: 13 mL via INTRAVENOUS

## 2014-06-14 ENCOUNTER — Other Ambulatory Visit: Payer: Self-pay | Admitting: Urology

## 2014-08-13 ENCOUNTER — Encounter (HOSPITAL_COMMUNITY): Payer: Self-pay

## 2014-08-13 ENCOUNTER — Encounter (HOSPITAL_COMMUNITY)
Admission: RE | Admit: 2014-08-13 | Discharge: 2014-08-13 | Disposition: A | Discharge status: Home / Self Care | Payer: BLUE CROSS/BLUE SHIELD | Source: Ambulatory Visit | Attending: Urology | Admitting: Urology

## 2014-08-13 DIAGNOSIS — Z01812 Encounter for preprocedural laboratory examination: Secondary | ICD-10-CM | POA: Insufficient documentation

## 2014-08-13 DIAGNOSIS — Z0183 Encounter for blood typing: Secondary | ICD-10-CM | POA: Insufficient documentation

## 2014-08-13 DIAGNOSIS — C61 Malignant neoplasm of prostate: Secondary | ICD-10-CM | POA: Diagnosis not present

## 2014-08-13 LAB — BASIC METABOLIC PANEL
Anion gap: 7 (ref 5–15)
BUN: 13 mg/dL (ref 6–20)
CALCIUM: 9.5 mg/dL (ref 8.9–10.3)
CO2: 30 mmol/L (ref 22–32)
Chloride: 102 mmol/L (ref 101–111)
Creatinine, Ser: 1.12 mg/dL (ref 0.61–1.24)
GFR calc Af Amer: >60 mL/min (ref >60)
Glucose, Bld: 93 mg/dL (ref 65–99)
Potassium: 4.6 mmol/L (ref 3.5–5.1)
SODIUM: 139 mmol/L (ref 135–145)

## 2014-08-13 LAB — ABO/RH: ABO/RH(D): O POS

## 2014-08-13 LAB — CBC
HEMATOCRIT: 44.4 % (ref 39.0–52.0)
Hemoglobin: 14.8 g/dL (ref 13.0–17.0)
MCH: 30 pg (ref 26.0–34.0)
MCHC: 33.3 g/dL (ref 30.0–36.0)
MCV: 89.9 fL (ref 78.0–100.0)
Platelets: 181 10*3/uL (ref 150–400)
RBC: 4.94 MIL/uL (ref 4.22–5.81)
RDW: 12 % (ref 11.5–15.5)
WBC: 4.9 10*3/uL (ref 4.0–10.5)

## 2014-08-13 NOTE — Patient Instructions (Signed)
[place]  08/13/2014   Your procedure is scheduled on:   08-18-2014 Wednesday  Enter through [place] and follow signs to [facility]. Arrive at   0630     AM .  (Limit 1 person with you).  Call this number if you have problems the morning of surgery: [phone]  Or Presurgical Testing [phone].   For Living Will and/or Health Care Power Attorney Forms: please provide copy for your medical record,may bring AM of surgery(Forms should be already notarized -we do not provide this service).(Yes, will provide  Information day of surgery).  Remember: Follow any bowel prep instructions per MD office.   Do not eat food/ or drink: After Midnight.      Take these medicines the morning of surgery with A SIP OF WATER: NONE.   Do not wear jewelry, make-up or nail polish.  Do not wear deodorant, lotions, powders, or perfumes.   Do not shave legs and under arms- 48 hours(2 days) prior to first CHG shower.(Shaving face and neck okay.)  Do not bring valuables to the hospital.(Hospital is not responsible for lost valuables).  Contacts, dentures or removable bridgework, body piercing, hair pins may not be worn into surgery.  Leave suitcase in the car. After surgery it may be brought to your room.  For patients admitted to the hospital, checkout time is 11:00 AM the day of discharge.(Restricted visitors-Any Persons displaying flu-like symptoms or illness).    Patients discharged the day of surgery will not be allowed to drive home. Must have responsible person with you x 24 hours once discharged.  Name and phone number of your driver: Isaac Ramsey -spouse [phone] cell     Please read over the following fact sheets that you were given:  CHG(Chlorhexidine Gluconate 4% Surgical Soap) use.  Remember : Type/Screen "Blue armbands" - may not be removed once applied(would result in being retested AM of surgery, if removed).         [facility] - Preparing for  Surgery Before surgery, you can play an important role.  Because skin is not sterile, your skin needs to be as free of germs as possible.  You can reduce the number of germs on your skin by washing with CHG (chlorahexidine gluconate) soap before surgery.  CHG is an antiseptic cleaner which kills germs and bonds with the skin to continue killing germs even after washing. Please DO NOT use if you have an allergy to CHG or antibacterial soaps.  If your skin becomes reddened/irritated stop using the CHG and inform your nurse when you arrive at Short Stay. Do not shave (including legs and underarms) for at least 48 hours prior to the first CHG shower.  You may shave your face/neck. Please follow these instructions carefully:  1.  Shower with CHG Soap the night before surgery and the  morning of Surgery.  2.  If you choose to wash your hair, wash your hair first as usual with your  normal  shampoo.  3.  After you shampoo, rinse your hair and body thoroughly to remove the  shampoo.                           4.  Use CHG as you would any other liquid soap.  You can apply chg directly  to the skin and wash  Gently with a scrungie or clean washcloth.  5.  Apply the CHG Soap to your body ONLY FROM THE NECK DOWN.   Do not use on face/ open                           Wound or open sores. Avoid contact with eyes, ears mouth and genitals (private parts).                       Wash face,  Genitals (private parts) with your normal soap.             6.  Wash thoroughly, paying special attention to the area where your surgery  will be performed.  7.  Thoroughly rinse your body with warm water from the neck down.  8.  DO NOT shower/wash with your normal soap after using and rinsing off  the CHG Soap.                9.  Pat yourself dry with a clean towel.            10.  Wear clean pajamas.            11.  Place clean sheets on your bed the night of your first shower and do not  sleep with pets. Day  of Surgery : Do not apply any lotions/deodorants the morning of surgery.  Please wear clean clothes to the hospital/surgery center.  FAILURE TO FOLLOW THESE INSTRUCTIONS MAY RESULT IN THE CANCELLATION OF YOUR SURGERY PATIENT SIGNATURE_________________________________  NURSE SIGNATURE__________________________________  ________________________________________________________________________

## 2014-08-17 MED ORDER — GENTAMICIN SULFATE 40 MG/ML IJ SOLN
320.0000 mg | INTRAVENOUS | Status: AC
  Administered 2014-08-18: 320 mg via INTRAVENOUS
  Filled 2014-08-17: qty 8

## 2014-08-18 ENCOUNTER — Encounter (HOSPITAL_COMMUNITY): Payer: Self-pay | Admitting: *Deleted

## 2014-08-18 ENCOUNTER — Inpatient Hospital Stay (HOSPITAL_COMMUNITY): Payer: BLUE CROSS/BLUE SHIELD | Admitting: Anesthesiology

## 2014-08-18 ENCOUNTER — Inpatient Hospital Stay (HOSPITAL_COMMUNITY)
Admission: RE | Admit: 2014-08-18 | Discharge: 2014-08-20 | DRG: 708 | Disposition: A | Discharge status: Home / Self Care | Payer: BLUE CROSS/BLUE SHIELD | Source: Ambulatory Visit | Attending: Urology | Admitting: Urology

## 2014-08-18 ENCOUNTER — Encounter (HOSPITAL_COMMUNITY)
Admission: RE | Disposition: A | Discharge status: Home / Self Care | Payer: Self-pay | Source: Ambulatory Visit | Attending: Urology

## 2014-08-18 DIAGNOSIS — Z87891 Personal history of nicotine dependence: Secondary | ICD-10-CM

## 2014-08-18 DIAGNOSIS — K4090 Unilateral inguinal hernia, without obstruction or gangrene, not specified as recurrent: Secondary | ICD-10-CM | POA: Diagnosis present

## 2014-08-18 DIAGNOSIS — Z79899 Other long term (current) drug therapy: Secondary | ICD-10-CM

## 2014-08-18 DIAGNOSIS — Z9852 Vasectomy status: Secondary | ICD-10-CM | POA: Diagnosis not present

## 2014-08-18 DIAGNOSIS — C61 Malignant neoplasm of prostate: Principal | ICD-10-CM | POA: Diagnosis present

## 2014-08-18 HISTORY — PX: ROBOT ASSISTED LAPAROSCOPIC RADICAL PROSTATECTOMY: SHX5141

## 2014-08-18 HISTORY — PX: LYMPHADENECTOMY: SHX5960

## 2014-08-18 LAB — TYPE AND SCREEN
ABO/RH(D): O POS
Antibody Screen: NEGATIVE

## 2014-08-18 LAB — HEMOGLOBIN AND HEMATOCRIT, BLOOD
HEMATOCRIT: 39.7 % (ref 39.0–52.0)
HEMOGLOBIN: 13.4 g/dL (ref 13.0–17.0)

## 2014-08-18 SURGERY — ROBOTIC ASSISTED LAPAROSCOPIC RADICAL PROSTATECTOMY
Anesthesia: General

## 2014-08-18 MED ORDER — DEXAMETHASONE SODIUM PHOSPHATE 10 MG/ML IJ SOLN
INTRAMUSCULAR | Status: AC
  Filled 2014-08-18: qty 1

## 2014-08-18 MED ORDER — EPHEDRINE SULFATE 50 MG/ML IJ SOLN
INTRAMUSCULAR | Status: AC
  Filled 2014-08-18: qty 1

## 2014-08-18 MED ORDER — PROPOFOL 10 MG/ML IV BOLUS
INTRAVENOUS | Status: DC | PRN
  Administered 2014-08-18: 200 mg via INTRAVENOUS

## 2014-08-18 MED ORDER — SULFAMETHOXAZOLE-TRIMETHOPRIM 800-160 MG PO TABS: 1.0000 | ORAL_TABLET | Freq: Two times a day (BID) | ORAL | Status: DC

## 2014-08-18 MED ORDER — PROPOFOL 10 MG/ML IV BOLUS
INTRAVENOUS | Status: AC
  Filled 2014-08-18: qty 20

## 2014-08-18 MED ORDER — HYDROMORPHONE HCL 2 MG/ML IJ SOLN
INTRAMUSCULAR | Status: AC
  Filled 2014-08-18: qty 1

## 2014-08-18 MED ORDER — LIDOCAINE HCL (CARDIAC) 20 MG/ML IV SOLN
INTRAVENOUS | Status: AC
  Filled 2014-08-18: qty 10

## 2014-08-18 MED ORDER — FENTANYL CITRATE (PF) 100 MCG/2ML IJ SOLN
25.0000 ug | INTRAMUSCULAR | Status: DC | PRN
  Administered 2014-08-18: 25 ug via INTRAVENOUS
  Administered 2014-08-18: 50 ug via INTRAVENOUS
  Administered 2014-08-18: 25 ug via INTRAVENOUS

## 2014-08-18 MED ORDER — LIDOCAINE HCL (CARDIAC) 20 MG/ML IV SOLN
INTRAVENOUS | Status: DC | PRN
  Administered 2014-08-18: 100 mg via INTRAVENOUS

## 2014-08-18 MED ORDER — ACETAMINOPHEN 500 MG PO TABS
1000.0000 mg | ORAL_TABLET | Freq: Four times a day (QID) | ORAL | Status: DC
  Administered 2014-08-18 (×2): 1000 mg via ORAL
  Filled 2014-08-18 (×3): qty 2

## 2014-08-18 MED ORDER — OXYCODONE HCL 5 MG PO TABS
5.0000 mg | ORAL_TABLET | ORAL | Status: DC | PRN
  Administered 2014-08-19 – 2014-08-20 (×3): 5 mg via ORAL
  Filled 2014-08-18 (×3): qty 1

## 2014-08-18 MED ORDER — DEXTROSE-NACL 5-0.45 % IV SOLN
INTRAVENOUS | Status: DC
  Administered 2014-08-18 – 2014-08-19 (×3): via INTRAVENOUS

## 2014-08-18 MED ORDER — FENTANYL CITRATE (PF) 100 MCG/2ML IJ SOLN
INTRAMUSCULAR | Status: AC
  Filled 2014-08-18: qty 2

## 2014-08-18 MED ORDER — GLYCOPYRROLATE 0.2 MG/ML IJ SOLN
INTRAMUSCULAR | Status: AC
  Filled 2014-08-18: qty 2

## 2014-08-18 MED ORDER — HYDROMORPHONE HCL 1 MG/ML IJ SOLN
INTRAMUSCULAR | Status: DC | PRN
  Administered 2014-08-18: .3 mg via INTRAVENOUS
  Administered 2014-08-18 (×3): .4 mg via INTRAVENOUS

## 2014-08-18 MED ORDER — MIDAZOLAM HCL 2 MG/2ML IJ SOLN
INTRAMUSCULAR | Status: AC
  Filled 2014-08-18: qty 4

## 2014-08-18 MED ORDER — GLYCOPYRROLATE 0.2 MG/ML IJ SOLN
INTRAMUSCULAR | Status: DC | PRN
  Administered 2014-08-18: .6 mg via INTRAVENOUS

## 2014-08-18 MED ORDER — MENTHOL 3 MG MT LOZG
1.0000 | LOZENGE | OROMUCOSAL | Status: DC | PRN
  Filled 2014-08-18: qty 9

## 2014-08-18 MED ORDER — ARTIFICIAL TEARS OP OINT
TOPICAL_OINTMENT | OPHTHALMIC | Status: AC
  Filled 2014-08-18: qty 3.5

## 2014-08-18 MED ORDER — BELLADONNA ALKALOIDS-OPIUM 16.2-60 MG RE SUPP
1.0000 | Freq: Four times a day (QID) | RECTAL | Status: DC | PRN
  Administered 2014-08-18: 1 via RECTAL
  Filled 2014-08-18: qty 1

## 2014-08-18 MED ORDER — LACTATED RINGERS IV SOLN
INTRAVENOUS | Status: DC | PRN
  Administered 2014-08-18 (×2): via INTRAVENOUS

## 2014-08-18 MED ORDER — ROCURONIUM BROMIDE 100 MG/10ML IV SOLN
INTRAVENOUS | Status: DC | PRN
  Administered 2014-08-18 (×4): 10 mg via INTRAVENOUS
  Administered 2014-08-18: 40 mg via INTRAVENOUS
  Administered 2014-08-18: 10 mg via INTRAVENOUS

## 2014-08-18 MED ORDER — HYDROMORPHONE HCL 1 MG/ML IJ SOLN: 0.5000 mg | INTRAMUSCULAR | Status: DC | PRN

## 2014-08-18 MED ORDER — LACTATED RINGERS IV SOLN
INTRAVENOUS | Status: DC
  Administered 2014-08-18: 14:00:00 via INTRAVENOUS

## 2014-08-18 MED ORDER — MIDAZOLAM HCL 5 MG/5ML IJ SOLN
INTRAMUSCULAR | Status: DC | PRN
  Administered 2014-08-18: 2 mg via INTRAVENOUS

## 2014-08-18 MED ORDER — EPHEDRINE SULFATE 50 MG/ML IJ SOLN
INTRAMUSCULAR | Status: DC | PRN
  Administered 2014-08-18: 5 mg via INTRAVENOUS
  Administered 2014-08-18: 10 mg via INTRAVENOUS
  Administered 2014-08-18 (×3): 5 mg via INTRAVENOUS

## 2014-08-18 MED ORDER — ONDANSETRON HCL 4 MG/2ML IJ SOLN
INTRAMUSCULAR | Status: DC | PRN
  Administered 2014-08-18: 4 mg via INTRAVENOUS

## 2014-08-18 MED ORDER — ONDANSETRON HCL 4 MG/2ML IJ SOLN
INTRAMUSCULAR | Status: AC
  Filled 2014-08-18: qty 2

## 2014-08-18 MED ORDER — LACTATED RINGERS IR SOLN
Status: DC | PRN
  Administered 2014-08-18: 1000 mL

## 2014-08-18 MED ORDER — DEXAMETHASONE SODIUM PHOSPHATE 10 MG/ML IJ SOLN
INTRAMUSCULAR | Status: DC | PRN
  Administered 2014-08-18: 10 mg via INTRAVENOUS

## 2014-08-18 MED ORDER — CEFAZOLIN SODIUM-DEXTROSE 2-3 GM-% IV SOLR
INTRAVENOUS | Status: AC
  Administered 2014-08-18: 2 g via INTRAVENOUS
  Filled 2014-08-18: qty 50

## 2014-08-18 MED ORDER — HYDROCODONE-ACETAMINOPHEN 5-325 MG PO TABS: 1.0000 | ORAL_TABLET | Freq: Four times a day (QID) | ORAL | Status: DC | PRN

## 2014-08-18 MED ORDER — ROCURONIUM BROMIDE 100 MG/10ML IV SOLN
INTRAVENOUS | Status: AC
  Filled 2014-08-18: qty 1

## 2014-08-18 MED ORDER — ONDANSETRON HCL 4 MG/2ML IJ SOLN
4.0000 mg | INTRAMUSCULAR | Status: DC | PRN
  Administered 2014-08-19 – 2014-08-20 (×2): 4 mg via INTRAVENOUS
  Filled 2014-08-18 (×2): qty 2

## 2014-08-18 MED ORDER — SODIUM CHLORIDE 0.9 % IV BOLUS (SEPSIS)
1000.0000 mL | Freq: Once | INTRAVENOUS | Status: AC
  Administered 2014-08-18: 1000 mL via INTRAVENOUS

## 2014-08-18 MED ORDER — PROMETHAZINE HCL 25 MG/ML IJ SOLN: 6.2500 mg | INTRAMUSCULAR | Status: DC | PRN

## 2014-08-18 MED ORDER — SODIUM CHLORIDE 0.9 % IJ SOLN
INTRAMUSCULAR | Status: AC
  Filled 2014-08-18: qty 10

## 2014-08-18 MED ORDER — NEOSTIGMINE METHYLSULFATE 10 MG/10ML IV SOLN
INTRAVENOUS | Status: AC
  Filled 2014-08-18: qty 1

## 2014-08-18 MED ORDER — STERILE WATER FOR IRRIGATION IR SOLN
Status: DC | PRN
  Administered 2014-08-18: 1000 mL

## 2014-08-18 MED ORDER — BUPIVACAINE LIPOSOME 1.3 % IJ SUSP
20.0000 mL | Freq: Once | INTRAMUSCULAR | Status: AC
  Administered 2014-08-18: 20 mL
  Filled 2014-08-18: qty 20

## 2014-08-18 MED ORDER — FENTANYL CITRATE (PF) 250 MCG/5ML IJ SOLN
INTRAMUSCULAR | Status: AC
  Filled 2014-08-18: qty 25

## 2014-08-18 MED ORDER — SUCCINYLCHOLINE CHLORIDE 20 MG/ML IJ SOLN
INTRAMUSCULAR | Status: DC | PRN
  Administered 2014-08-18: 100 mg via INTRAVENOUS

## 2014-08-18 MED ORDER — FENTANYL CITRATE (PF) 100 MCG/2ML IJ SOLN
INTRAMUSCULAR | Status: DC | PRN
  Administered 2014-08-18: 50 ug via INTRAVENOUS
  Administered 2014-08-18: 150 ug via INTRAVENOUS
  Administered 2014-08-18: 50 ug via INTRAVENOUS

## 2014-08-18 MED ORDER — NEOSTIGMINE METHYLSULFATE 10 MG/10ML IV SOLN
INTRAVENOUS | Status: DC | PRN
  Administered 2014-08-18: 4 mg via INTRAVENOUS

## 2014-08-18 MED ORDER — MEPERIDINE HCL 50 MG/ML IJ SOLN: 6.2500 mg | INTRAMUSCULAR | Status: DC | PRN

## 2014-08-18 SURGICAL SUPPLY — 57 items
CABLE HIGH FREQUENCY MONO STRZ (ELECTRODE) ×3 IMPLANT
CATH FOLEY 2WAY SLVR 18FR 30CC (CATHETERS) ×3 IMPLANT
CATH TIEMANN FOLEY 18FR 5CC (CATHETERS) ×3 IMPLANT
CHLORAPREP W/TINT 26ML (MISCELLANEOUS) ×3 IMPLANT
CLIP LIGATING HEM O LOK PURPLE (MISCELLANEOUS) ×7 IMPLANT
CLOTH BEACON ORANGE TIMEOUT ST (SAFETY) ×3 IMPLANT
CONT SPEC 4OZ CLIKSEAL STRL BL (MISCELLANEOUS) ×3 IMPLANT
COVER SURGICAL LIGHT HANDLE (MISCELLANEOUS) ×3 IMPLANT
COVER TIP SHEARS 8 DVNC (MISCELLANEOUS) ×2 IMPLANT
COVER TIP SHEARS 8MM DA VINCI (MISCELLANEOUS) ×1
CUTTER ECHEON FLEX ENDO 45 340 (ENDOMECHANICALS) ×3 IMPLANT
DECANTER SPIKE VIAL GLASS SM (MISCELLANEOUS) ×3 IMPLANT
DRSG TEGADERM 4X4.75 (GAUZE/BANDAGES/DRESSINGS) ×6 IMPLANT
DRSG TEGADERM 6X8 (GAUZE/BANDAGES/DRESSINGS) ×4 IMPLANT
ELECT REM PT RETURN 9FT ADLT (ELECTROSURGICAL) ×3
ELECTRODE REM PT RTRN 9FT ADLT (ELECTROSURGICAL) ×2 IMPLANT
GAUZE SPONGE 2X2 8PLY STRL LF (GAUZE/BANDAGES/DRESSINGS) ×2 IMPLANT
GLOVE BIO SURGEON STRL SZ 6.5 (GLOVE) ×3 IMPLANT
GLOVE BIOGEL M STRL SZ7.5 (GLOVE) ×6 IMPLANT
GLOVE BIOGEL PI IND STRL 7.5 (GLOVE) ×2 IMPLANT
GLOVE BIOGEL PI INDICATOR 7.5 (GLOVE) ×1
GOWN STRL REUS W/TWL LRG LVL3 (GOWN DISPOSABLE) ×6 IMPLANT
GOWN STRL REUS W/TWL LRG LVL4 (GOWN DISPOSABLE) ×9 IMPLANT
HEMOSTAT SURGICEL 4X8 (HEMOSTASIS) ×1 IMPLANT
HOLDER FOLEY CATH W/STRAP (MISCELLANEOUS) ×3 IMPLANT
IV LACTATED RINGERS 1000ML (IV SOLUTION) ×3 IMPLANT
KIT ACCESSORY DA VINCI DISP (KITS) ×1
KIT ACCESSORY DVNC DISP (KITS) ×2 IMPLANT
KIT PROCEDURE DA VINCI SI (MISCELLANEOUS) ×1
KIT PROCEDURE DVNC SI (MISCELLANEOUS) ×2 IMPLANT
LIQUID BAND (GAUZE/BANDAGES/DRESSINGS) ×3 IMPLANT
NDL INSUFFLATION 14GA 120MM (NEEDLE) ×2 IMPLANT
NDL SPNL 22GX7 QUINCKE BK (NEEDLE) ×2 IMPLANT
NEEDLE INSUFFLATION 14GA 120MM (NEEDLE) ×3 IMPLANT
NEEDLE SPNL 22GX7 QUINCKE BK (NEEDLE) ×3 IMPLANT
PACK ROBOT UROLOGY CUSTOM (CUSTOM PROCEDURE TRAY) ×3 IMPLANT
PAD POSITIONING PINK XL (MISCELLANEOUS) IMPLANT
RELOAD GREEN ECHELON 45 (STAPLE) ×3 IMPLANT
SET TUBE IRRIG SUCTION NO TIP (IRRIGATION / IRRIGATOR) ×3 IMPLANT
SHEET LAVH (DRAPES) ×1 IMPLANT
SOLUTION ELECTROLUBE (MISCELLANEOUS) ×3 IMPLANT
SPONGE GAUZE 2X2 STER 10/PKG (GAUZE/BANDAGES/DRESSINGS)
SPONGE LAP 4X18 X RAY DECT (DISPOSABLE) ×3 IMPLANT
SUT ETHILON 3 0 PS 1 (SUTURE) ×3 IMPLANT
SUT MNCRL AB 4-0 PS2 18 (SUTURE) ×6 IMPLANT
SUT PDS AB 1 CT1 27 (SUTURE) ×6 IMPLANT
SUT PROLENE 1 CT 1 30 (SUTURE) ×2 IMPLANT
SUT VIC AB 2-0 SH 27 (SUTURE) ×3
SUT VIC AB 2-0 SH 27X BRD (SUTURE) ×2 IMPLANT
SUT VICRYL 0 UR6 27IN ABS (SUTURE) ×3 IMPLANT
SUT VLOC BARB 180 ABS3/0GR12 (SUTURE) ×9
SUTURE VLOC BRB 180 ABS3/0GR12 (SUTURE) ×6 IMPLANT
SYR 27GX1/2 1ML LL SAFETY (SYRINGE) ×3 IMPLANT
TOWEL OR 17X26 10 PK STRL BLUE (TOWEL DISPOSABLE) ×3 IMPLANT
TOWEL OR NON WOVEN STRL DISP B (DISPOSABLE) ×3 IMPLANT
TROCAR 12M 150ML BLUNT (TROCAR) ×3 IMPLANT
WATER STERILE IRR 1500ML POUR (IV SOLUTION) ×4 IMPLANT

## 2014-08-18 NOTE — Progress Notes (Signed)
S:  Mild abd soreness POD 0 s/p prostatectomy. NO n/v.   O: NAD, family at bedside SNTND JP with scant output Foley with dark yellow urine w/o clots SCD's in place.  Hgb >12 post-op.  A/P 1 - Doing well POD 0. Adv to reg diet, ambulate. Goals for discharge and typical time course discussed.

## 2014-08-18 NOTE — Discharge Instructions (Signed)

## 2014-08-18 NOTE — Anesthesia Procedure Notes (Signed)
Procedure Name: Intubation Date/Time: 08/18/2014 8:37 AM Performed by: Vaanya Shambaugh, Virgel Gess Pre-anesthesia Checklist: Patient identified, Emergency Drugs available, Suction available, Patient being monitored and Timeout performed Patient Re-evaluated:Patient Re-evaluated prior to inductionOxygen Delivery Method: Circle system utilized Preoxygenation: Pre-oxygenation with 100% oxygen Intubation Type: IV induction Ventilation: Mask ventilation without difficulty Laryngoscope Size: Mac and 4 Grade View: Grade III Tube type: Oral Tube size: 7.5 mm Number of attempts: 1 Airway Equipment and Method: Stylet Placement Confirmation: ETT inserted through vocal cords under direct vision,  positive ETCO2,  CO2 detector and breath sounds checked- equal and bilateral Secured at: 23 cm Tube secured with: Tape Dental Injury: Teeth and Oropharynx as per pre-operative assessment  Future Recommendations: Recommend- induction with short-acting agent, and alternative techniques readily available Comments: Overbite, dentition, short TM distance, slightly anterior airway, easy pass ETT first attempt.

## 2014-08-18 NOTE — H&P (Signed)
Isaac Ramsey is an 59 y.o. male.    Chief Complaint: Pre-op Robotic Prostatectomy with Bilateral Pelvic Lymphadenectomy  HPI:    1 - High Risk Prostate Cancer 10/2012 - PSA 5.34 -->  Gleason 3+3 = 6 involving 1 core with 2% that core involved --> active surveillance 04/2013 --> PSA 5.6 Gleason 3+3 equal 6 involving 2 cores with 30% of each corek, Vol 82mL --> continued surveillance; 08/2013 - PSA 8.93 05/2014 PSA 12.03 --> Gleason 6 RLB, RMB and Gleason 4+4=8 RMM (TRUS 54mL) ; Prostate MRI no obvious ECE or pelvic adenopathy.   PMH unremarkable. NO CV disease. NO prior surgeries. NO blood thinners. His PCP is Carolann Littler MD.  [name] is seen to proceed with robotic prostatectomy. No interval fevers.   Past Medical History  Diagnosis Date  . Chickenpox   . Cancer     dx. Prostate cancer after biopsy 3'16    Past Surgical History  Procedure Laterality Date  . Vasectomy    . Prostate biopsy      3'16 -dx. prostate cancer    Family History  Problem Relation Age of Onset  . Hypertension Mother   . Arthritis Mother   . Hyperlipidemia Father   . Hypertension Father   . Heart disease Father 78    CAD  . Alcohol abuse Brother    Social History:  reports that he quit smoking about 30 years ago. He has never used smokeless tobacco. He reports that he does not drink alcohol or use illicit drugs.  Allergies: No Known Allergies  Medications Prior to Admission  Medication Sig Dispense Refill  . CIALIS 20 MG tablet Take 1 tablet by mouth. About four hours before intercourse. Do not take more than every 3 days.  1  . LEVITRA 20 MG tablet Take 1 tablet by mouth daily as needed (one horu prior to intercourse.).   1  . trimethoprim-polymyxin b (POLYTRIM) ophthalmic solution Place 2 drops into both eyes every 4 (four) hours. (Patient not taking: Reported on 08/10/2014) 10 mL 0    No results found for this or any previous visit (from the past 48 hour(s)). No results found.  Review  of Systems  Constitutional: Negative.   HENT: Negative.   Eyes: Negative.   Respiratory: Negative.   Cardiovascular: Negative.   Gastrointestinal: Negative.  Negative for nausea and vomiting.  Genitourinary: Negative.  Negative for hematuria.  Musculoskeletal: Negative.   Skin: Negative.   Neurological: Negative.   Endo/Heme/Allergies: Negative.   Psychiatric/Behavioral: Negative.     Blood pressure 107/72, pulse 92, temperature 98 F (36.7 C), temperature source Oral, resp. rate 18, SpO2 99 %. Physical Exam  Constitutional: He appears well-developed.  HENT:  Head: Normocephalic.  Eyes: Pupils are equal, round, and reactive to light.  Neck: Normal range of motion.  Cardiovascular: Normal rate.   Respiratory: Effort normal.  GI: Soft.  No scars  Genitourinary: Penis normal.  No CVAT  Musculoskeletal: Normal range of motion.  Neurological: He is alert.  Skin: Skin is warm.  Psychiatric: He has a normal mood and affect. His behavior is normal. Judgment and thought content normal.     Assessment/Plan  1 - High Risk Prostate Cancer - PSA rising and higher grade / volume disease that remains clinically localized in man with minimal comorbidity.   We rediscussed prostatectomy and specifically robotic prostatectomy with bilateral pelvic lymphadenectomy being the technique that I most commonly perform. I showed the patient on their abdomen the approximately 6  small incision (trocar) sites as well as presumed extraction sites with robotic approach as well as possible open incision sites should open conversion be necessary. We rediscussed peri-operative risks including bleeding, infection, deep vein thrombosis, pulmonary embolism, compartment syndrome, nuropathy / neuropraxia, heart attack, stroke, death, as well as long-term risks such as non-cure / need for additional therapy. We specifically readdressed that the procedure would compromise urinary control leading to stress incontinence  which typically resolves with time and pelvic rehabilitation (Kegel's, etc..), but can sometimes be permanent and require additional therapy including surgery. We also specifically readdressed sexual sequellae including significant erectile dysfunction which typically partially resolves with time but can also be permanent and require additional therapy including surgery.   We rediscussed the typical hospital course including usual 1-2 night hospitalization, discharge with foley catheter in place usually for 1-2 weeks before voiding trial as well as usually 2 week recovery until able to perform most non-strenuous activity and 6 weeks until able to return to most jobs and more strenuous activity such as exercise.   Pt voiced understanding and desire to proceed today as planned.  Lexandra Rettke 08/18/2014, 6:30 AM

## 2014-08-18 NOTE — Anesthesia Preprocedure Evaluation (Addendum)
Anesthesia Evaluation  Patient identified by MRN, date of birth, ID band Patient awake    Reviewed: Allergy & Precautions, NPO status , Patient's Chart, lab work & pertinent test results  Airway Mallampati: II  TM Distance: >3 FB Neck ROM: Full    Dental no notable dental hx. (+) Caps   Pulmonary neg pulmonary ROS, former smoker,  breath sounds clear to auscultation  Pulmonary exam normal       Cardiovascular negative cardio ROS Normal cardiovascular examRhythm:Regular Rate:Normal     Neuro/Psych negative neurological ROS  negative psych ROS   GI/Hepatic negative GI ROS, Neg liver ROS,   Endo/Other  negative endocrine ROS  Renal/GU negative Renal ROS  negative genitourinary   Musculoskeletal negative musculoskeletal ROS (+)   Abdominal   Peds negative pediatric ROS (+)  Hematology negative hematology ROS (+)   Anesthesia Other Findings   Reproductive/Obstetrics negative OB ROS                            Anesthesia Physical Anesthesia Plan  ASA: I  Anesthesia Plan: General   Post-op Pain Management:    Induction: Intravenous  Airway Management Planned: Oral ETT  Additional Equipment:   Intra-op Plan:   Post-operative Plan: Extubation in OR  Informed Consent: I have reviewed the patients History and Physical, chart, labs and discussed the procedure including the risks, benefits and alternatives for the proposed anesthesia with the patient or authorized representative who has indicated his/her understanding and acceptance.   Dental advisory given  Plan Discussed with: CRNA  Anesthesia Plan Comments:         Anesthesia Quick Evaluation

## 2014-08-18 NOTE — Transfer of Care (Signed)
Immediate Anesthesia Transfer of Care Note  Patient: Isaac Ramsey  Procedure(s) Performed: Procedure(s): ROBOTIC ASSISTED LAPAROSCOPIC RADICAL PROSTATECTOMY WITH INDOCYANINE GREEN DYE (N/A) BILATERAL PELVIC LYMPHADENECTOMY WITH RIGHT INGUINAL HERNIA REPAIR (Bilateral)  Patient Location: PACU  Anesthesia Type:General  Level of Consciousness:  sedated, patient cooperative and responds to stimulation  Airway & Oxygen Therapy:Patient Spontanous Breathing and Patient connected to face mask oxgen  Post-op Assessment:  Report given to PACU RN and Post -op Vital signs reviewed and stable  Post vital signs:  Reviewed and stable  Last Vitals:  Filed Vitals:   08/18/14 0622  BP: 107/72  Pulse: 92  Temp: 36.7 C  Resp: 18    Complications: No apparent anesthesia complications

## 2014-08-18 NOTE — Brief Op Note (Signed)
08/18/2014  12:01 PM  PATIENT:  Myra Rude  59 y.o. male  PRE-OPERATIVE DIAGNOSIS:  HIGH RISK PROSTATE CANCER  POST-OPERATIVE DIAGNOSIS:  HIGH RISK PROSTATE CANCER  PROCEDURE:  Procedure(s): ROBOTIC ASSISTED LAPAROSCOPIC RADICAL PROSTATECTOMY WITH INDOCYANINE GREEN DYE (N/A) BILATERAL PELVIC LYMPHADENECTOMY WITH RIGHT INGUINAL HERNIA REPAIR (Bilateral)  SURGEON:  Surgeon(s) and Role:    * Alexis Frock, MD - Primary  PHYSICIAN ASSISTANT:   ASSISTANTS: 1 - Clemetine Marker PA; 2 - Verdis Frederickson MD   ANESTHESIA:   general  EBL:  Total I/O In: 1500 [I.V.:1500] Out: 300 [Urine:200; Blood:100]  BLOOD ADMINISTERED:none  DRAINS: 1 - foley to gravity; 2 - JP to bulb   LOCAL MEDICATIONS USED:  MARCAINE     SPECIMEN:  Source of Specimen:  1 - periprostatic fat 2- pelvic lymph nodes 3 - prostatectomy 4- post bladder neck margin  DISPOSITION OF SPECIMEN:  PATHOLOGY  COUNTS:  YES  TOURNIQUET:  * No tourniquets in log *  DICTATION: .Other Dictation: Dictation Number  [id number]  PLAN OF CARE: Admit to inpatient   PATIENT DISPOSITION:  PACU - hemodynamically stable.   Delay start of Pharmacological VTE agent (>24hrs) due to surgical blood loss or risk of bleeding: yes

## 2014-08-18 NOTE — Anesthesia Postprocedure Evaluation (Signed)
  Anesthesia Post-op Note  Patient: Damir Leung  Procedure(s) Performed: Procedure(s) (LRB): ROBOTIC ASSISTED LAPAROSCOPIC RADICAL PROSTATECTOMY WITH INDOCYANINE GREEN DYE (N/A) BILATERAL PELVIC LYMPHADENECTOMY WITH RIGHT INGUINAL HERNIA REPAIR (Bilateral)  Patient Location: PACU  Anesthesia Type: General  Level of Consciousness: awake and alert   Airway and Oxygen Therapy: Patient Spontanous Breathing  Post-op Pain: mild  Post-op Assessment: Post-op Vital signs reviewed, Patient's Cardiovascular Status Stable, Respiratory Function Stable, Patent Airway and No signs of Nausea or vomiting  Last Vitals:  Filed Vitals:   08/18/14 1345  BP: 125/59  Pulse: 68  Temp: 36.4 C  Resp: 15    Post-op Vital Signs: stable   Complications: No apparent anesthesia complications

## 2014-08-19 ENCOUNTER — Encounter (HOSPITAL_COMMUNITY): Payer: Self-pay | Admitting: Urology

## 2014-08-19 LAB — BASIC METABOLIC PANEL
Anion gap: 7 (ref 5–15)
BUN: 11 mg/dL (ref 6–20)
CALCIUM: 7.9 mg/dL — AB (ref 8.9–10.3)
CO2: 25 mmol/L (ref 22–32)
CREATININE: 0.94 mg/dL (ref 0.61–1.24)
Chloride: 98 mmol/L — ABNORMAL LOW (ref 101–111)
GFR calc Af Amer: >60 mL/min (ref >60)
GLUCOSE: 135 mg/dL — AB (ref 65–99)
Potassium: 3.7 mmol/L (ref 3.5–5.1)
SODIUM: 130 mmol/L — AB (ref 135–145)

## 2014-08-19 LAB — HEMOGLOBIN AND HEMATOCRIT, BLOOD
HCT: 33.9 % — ABNORMAL LOW (ref 39.0–52.0)
HEMOGLOBIN: 11.3 g/dL — AB (ref 13.0–17.0)

## 2014-08-19 MED ORDER — SIMETHICONE 80 MG PO CHEW
80.0000 mg | CHEWABLE_TABLET | Freq: Four times a day (QID) | ORAL | Status: DC | PRN
  Administered 2014-08-19 (×2): 80 mg via ORAL
  Filled 2014-08-19 (×2): qty 1

## 2014-08-19 MED ORDER — PHENOL 1.4 % MT LIQD
1.0000 | OROMUCOSAL | Status: DC | PRN
  Administered 2014-08-19: 1 via OROMUCOSAL
  Filled 2014-08-19: qty 177

## 2014-08-19 MED ORDER — KETOROLAC TROMETHAMINE 30 MG/ML IJ SOLN
30.0000 mg | Freq: Three times a day (TID) | INTRAMUSCULAR | Status: DC
Start: 2014-08-19 — End: 2014-08-20
  Administered 2014-08-19 – 2014-08-20 (×5): 30 mg via INTRAVENOUS
  Filled 2014-08-19 (×5): qty 1

## 2014-08-19 MED ORDER — BISACODYL 10 MG RE SUPP
10.0000 mg | Freq: Once | RECTAL | Status: AC
  Administered 2014-08-19: 10 mg via RECTAL
  Filled 2014-08-19: qty 1

## 2014-08-19 NOTE — Care Management Note (Signed)
Case Management Note  Patient Details  Name: Isaac Ramsey MRN: [id number] Date of Birth: 05/04/1955  Subjective/Objective:                   Pre-op Robotic Prostatectomy with Bilateral Pelvic Lymphadenectomy Action/Plan:  Discharge planning Expected Discharge Date:  08/19/14               Expected Discharge Plan:  Home/Self Care  In-House Referral:     Discharge planning Services  CM Consult  Post Acute Care Choice:    Choice offered to:     DME Arranged:    DME Agency:     HH Arranged:    HH Agency:     Status of Service:  In process, will continue to follow  Medicare Important Message Given:    Date Medicare IM Given:    Medicare IM give by:    Date Additional Medicare IM Given:    Additional Medicare Important Message give by:     If discussed at [place] of Stay Meetings, dates discussed:    Additional Comments: Utilization complete.  Pt from home and CM to follow for progression.  Dellie Catholic, RN 08/19/2014, 12:30 PM

## 2014-08-19 NOTE — Progress Notes (Signed)
1 Day Post-Op Subjective: NAEON. Feeling bloated, + belching, - BM/flatus, ambulated x4, some throat discomfort with minimal relief from throat lozenges. Minimal PO intake.  Objective: Vital signs in last 24 hours: Temp:  [97.4 F (36.3 C)-98.1 F (36.7 C)] 97.8 F (36.6 C) (08/18 0603) Pulse Rate:  [65-90] 65 (08/18 0603) Resp:  [10-18] 18 (08/18 0603) BP: (108-125)/(59-75) 117/75 mmHg (08/18 0603) SpO2:  [98 %-100 %] 98 % (08/18 0603)  Intake/Output from previous day: 08/17 0701 - 08/18 0700 In: 3364.6 [I.V.:3364.6] Out: 1380 [Urine:1135; Drains:145; Blood:100] Intake/Output this shift: Total I/O In: 1089.6 [I.V.:1089.6] Out: 830 [Urine:725; Drains:105]  Physical Exam:  General: Alert and oriented. CV: RRR Lungs: Clear bilaterally. GI: Soft, mildly distended. ATTP Incisions: Clean, dry, and intact Drain: serosang Urine: Clear Extremities: Nontender, no erythema, no edema.  Lab Results:  Recent Labs  08/18/14 1239 08/19/14 0440  HGB 13.4 11.3*  HCT 39.7 33.9*      Assessment/Plan: POD# 1 s/p robotic prostatectomy. Some bloating and abdominal fullness. Encouraging ambulation  1) IVF to 75 2) Ambulate, Incentive spirometry 3) Transition to oral pain medication 4) simethicone for gas 5) continue to monitor drain output 6) diet as tolerated   I have seen and examined the patient and agree with above assesment and plan.   S: POD 1 s/p robotic prostatectomy with pelvic lymphadenectomy and rt inguinal hernia repair. C/o abd soreness / bloating and some throat soreness.  O: NAD, wife in room Abd w/o r/g and only minimally distended. JP with scan serosaunguinas fluid. Foley clearing SCD's in place  A/P: 1- Remain in house. Ambulate, ketorolac scheduled, ducolax spp x1, explained likely expected gas / ileus symptoms that will improve with ambulation + time + minimizing narcotics if possible.    LOS: 1 day   Isaac Ramsey 08/19/2014, 7:00 AM

## 2014-08-19 NOTE — Op Note (Signed)
Isaac Ramsey, WANLESS NO.:  [id number]  MEDICAL RECORD NO.:  [id number]  LOCATION:  9458                         FACILITY:  [facility]  PHYSICIAN:  Alexis Frock, MD     DATE OF BIRTH:  April 08, 1955  DATE OF PROCEDURE: 08/18/2014                               OPERATIVE REPORT  PREOPERATIVE DIAGNOSIS:  High-risk prostate cancer.  POSTOPERATIVE DIAGNOSIS:  High-risk prostate cancer plus right inguinal hernia, indirect.  PROCEDURES: 1. Robotic-assisted laparoscopic radical prostatectomy. 2. Bilateral pelvic lymphadenectomy. 3. Injection of indocyanine green dye for sentinel lymphangiography. 4. Right laparoscopic inguinal hernia repair.  ASSISTANT:  Clemetine Marker, PA  ESTIMATED BLOOD LOSS:  100 mL.  COMPLICATIONS:  None.  SPECIMEN: 1. Periprostatic fat, permanent. 2. Radical prostatectomy for permanent. 3. Posterior bladder neck margin frozen section negative for     carcinoma. 4. Right external iliac lymph nodes. 5. Right obturator lymph nodes. 6. Right internal iliac lymph nodes, sentinel. 7. Left external iliac lymph nodes. 8. Left obturator lymph nodes, sentinel.  FINDINGS: 1. Weakly fluorescent right internal, sentinel iliac lymph node. 2. Avidly fluorescent left obturator sentinel lymph node. 3. Significant fascial defect of the internal inguinal ring on the     right.  This is concerning for indirect hernia given no violation,     space of Retzius and the patient's risk of any body habitus.  Thus,     it was felt that right inguinal hernia repair clearly warrants it.  DRAINS: 1. Jackson-Pratt drain to bulb suction. 2. Foley catheter to straight drain.  INDICATION:  [name] is a very pleasant 59 year old gentleman, who was found on workup with elevated PSA to have low-risk prostate cancer several years ago.  He has elected to undergo initial trial of surveillance which he was very compliant with on surveillance.  His PSA continued to rise  and his pathology became more aggressive now the high- risk disease with a focus of Gleason 8 adenocarcinoma on the right.  He previously had Gleason 6 disease on the left.  PSA has been 12 and the pelvic MRI corroborated no obvious stigmata of locally-advanced or distant disease.  Options were discussed with the patient in detail for primary manage including continued surveillance protocols versus surgical extirpation with and without minimally invasive assistance versus primary radiotherapy and he adamantly wished to proceed with robotic prostatectomy.  We discussed preoperatively given his high-risk disease, permanent wide resection would be recommended in the sexual implications of this.  Informed consent was obtained and placed in the medical record.  PROCEDURE IN DETAIL:  The patient being Isaac Ramsey, was verified. Procedure being radical prostatectomy was confirmed.  Procedure was carried out.  Time-out was performed.  Intravenous antibiotics were administered.  General endotracheal anesthesia was introduced.  The patient was placed into a low lithotomy position after tucking his arms with foam padding, placing on a pink and non-slide foam pad.  A test of steep Trendelenburg positioning was performed and he was found to be suitably positioned.  Sterile field was created by prepping and draping the patient's penis, perineum and proximal thighs using iodine and his infra-xiphoid abdomen using chlorhexidine gluconate.  Next, a high-flow, low-pressure pneumoperitoneum was obtained  using Veress technique in the infraumbilical midline having passed the aspiration and drop test. Next, a 12-mm robotic camera port was placed in the same location. Laparoscopic examination of the peritoneal cavity revealed no significant adhesions and no visceral injury.  Additional ports were placed as follows:  Right paramedian 8-mm robotic port, right far lateral 12-mm assistant port, left paramedian  8-mm robotic port, left far lateral 8-mm robotic port, and right paramedian 5-mm suction port. Robot was docked and passed through the electronic checks.  Next, attention was directed for the development of space of Retzius. Incision was made lateral to the left medial umbilical ligament from the midline towards the area of the internal ring and coursing along the iliac vessels towards the area of the ureter, which was positively identified.  During this dissection, the vas deferens was purposely visualized, ligated and uses above the handle for medial traction.  The left bladder was dissected away from the pelvic sidewall towards the area of the endopelvic fascia.  A mirror-imaged dissection was performed on the right side positively identifying the right ureter and purposely severing the right vas deferens.  Additional anterior attachments were taken down using cautery scissors.  This exposed the anterior base of the prostate, which was defatted with fibrofatty tissue set aside, labeled periprostatic fat to expose the area of the bladder neck and prostate junction more clearly.  Next, a 0.2 mL of indocyanine green dye was injected into each lobe of the prostate using a percutaneously- placed 18-gauge spinal needle just above the pubic symphysis and robotic guidance with intervening suction to prevent dye spillage, which did not occur.  Next, the endopelvic fascia was identified and swept away from the lateral aspect of the prostate in a base-to-apex orientation to expose the dorsal venous complex, which was carefully controlled using vascular load stapler taking great care to avoid membranous urethral injury, which did not occur.  Following this, it had been approximately 15 minutes post dye injection and pelvis was inspected under near- infrared fluorescence.  Sentinel lymphangiography revealed multiple lymphatic channels coursing over the lateral aspect of the prostate and bladder.   There was a single weekly avid node in the right internal iliac group and multiple very avid nodes in the left obturator group.  As such, standard template lymphangiography was performed on the right side first at the right external iliac group to the confines being right external iliac artery, vein, pelvic side wall, iliac bifurcation.  Lymphostasis was achieved with cold clips.  This was set aside and labeled the right external iliac lymph nodes.  The right obturator group was also removed with confines being right obturator nerve, pelvic side wall, external iliac vein.  Lymphostasis was again achieved with cold clips.  The single, weekly-avid sentinel lymph node on the right side was dissected free again this corresponding to an internal iliac lymph node, this was set aside and labeled as such.  The obturator nerve was inspected following these maneuvers and found to be uninjured.  A mirror-imaged lymphangiectomy was performed on the left side as left external iliac and left obturator groups respectively in the more proximal aspect of the obturator.  There was a dominant fluorescent node that was quite avid and this was noted.  As such, the left obturator nerve was inspected and found to be intact following these maneuvers.  Next, the bladder neck was identified moving the Foley catheter back and forth and dissection was performed in the anterior-posterior direction separating the bladder neck away  from the base of the prostate taking great care to avoid excessive caliber bladder neck, which did not occur.  Posterior dissection was performed by incising approximately 7 mm inferior- posterior to posterior lip of the prostate entering the plane of Denonvilliers.  Given the quite narrow caliber bladder neck, a section of the posterior bladder neck was set aside for frozen section and found to be negative for carcinoma.  Plane of Denonvilliers was carefully developed.  Bilateral vas  deferenses were encountered, dissected for distance of 4 cm, ligated, and placed on gentle superior traction. Bilateral seminal vesicles were dissected towards their tips and also placed on gentle superior traction and the posterior plane was further developed towards the apex of the prostate.  This exposed the vascular pedicles bilaterally.  First, on the left side, the left vascular pedicle was controlled using a sequential clipping technique performing minimal nerve-sparing given the patient's high-risk disease.  Similarly, the right pedicle was controlled taking a very purposely wide dissection again performing purposely non-nerve-sparing on the right as well. Apical dissection was performed in the anterior plane by identifying the membranous urethra placed into prostate on gentle superior traction and transecting coldly, this completely freed up the prostatectomy specimen, which was placed into an EndoCatch bag for later retrieval.  Next, digital rectal exam was performed using indicator glove and laparoscopic vision, and no violation was detected whatsoever.  Posterior reconstruction was performed using a single 3-0 V-Loc seizure reapproximating the posterior urethral plate to the posterior bladder neck bringing these structures into tension-free apposition.  Next, mucosa-to-mucosa anastomosis was performed using double-armed V-Loc suture from the 6 o'clock to 12 o'clock position with anchoring of this stitch to the puboprostatic ligaments at the 12 o'clock position, thus performing anterior reconstruction.  Any Foley catheter was then placed per urethra easily, which irrigated quantitatively.  Inspection of the pelvis after this revealed significant fascial defect of the right internal ring, consistent with likely impending indirect hernia, this area had been previously covered by the bladder.  It was felt that hernia repair was clearly warranted to prevent such hernias given  the patient was very skinny body habitus.  As such, Prolene was used #1 on the CT needle and figure-of-eight.  Resection was performed of anchoring the internal ring fascia to the superior aspect of the pelvic brim thus reapproximating the fascial defect to avoid such hernias.  Great care was taken to avoid any injury to the iliac vessels of the inferior epigastrics.  Hemostasis appeared excellent.  All sponge and needle counts were correct.  A closed suction drain was brought to the previous left lateral robotic port site into the area of the peritoneal cavity. The previous right 12-mm assistant port was closed at the level of the fascia using a Carter-Thomason suture passer under laparoscopic vision. Robot was undocked.  Specimen was retrieved by extending the previous camera port site inferiorly for distance approximately 3 cm and removing the prostatectomy specimens and setting aside for permanent pathology. The site was closed at the level of fascia using figure-of-eight PDS x3 followed by reapproximation of the Scarpa's using running Vicryl.  All incision sites were infiltrated with dilute Lyophilized Marcaine and closed at the level of the skin using subcuticular Monocryl followed by Dermabond.  Procedure was then terminated.  The patient tolerated the procedure well.  There were no immediate periprocedural complications and the patient was taken to the postanesthesia care unit in stable condition.          ______________________________  Alexis Frock, MD     TM/MEDQ  D:  08/18/2014  T:  08/19/2014  Job:  [id number]

## 2014-08-19 NOTE — Progress Notes (Signed)
1 Day Post-Op s/p RALP with BPLND and R inguinal hernia repair Subjective: Ambulating, tolerating increased PO intake, passing flatus. Small episode of emesis. Continues to have issues with gag reflex but improving. Pain well controlled. 60 serosang from JP drain today.  Objective: Vital signs in last 24 hours: Temp:  [97.8 F (36.6 C)-98.2 F (36.8 C)] 98.2 F (36.8 C) (08/18 1416) Pulse Rate:  [65-97] 97 (08/18 1416) Resp:  [16-18] 18 (08/18 1416) BP: (108-122)/(63-75) 122/70 mmHg (08/18 1416) SpO2:  [98 %-100 %] 100 % (08/18 1416)  Intake/Output from previous day: 08/17 0701 - 08/18 0700 In: 3864.6 [I.V.:3864.6] Out: 1380 [Urine:1135; Drains:145; Blood:100] Intake/Output this shift: Total I/O In: -  Out: 55 [Drains:55]  Physical Exam:  General: Alert and oriented CV: RRR Lungs: Clear Abdomen: Soft, ND Incisions: c/d/i Drain: serosang Foley: clear, yellow Ext: NT, No erythema  Lab Results:  Recent Labs  08/18/14 1239 08/19/14 0440  HGB 13.4 11.3*  HCT 39.7 33.9*   BMET  Recent Labs  08/19/14 0440  NA 130*  K 3.7  CL 98*  CO2 25  GLUCOSE 135*  BUN 11  CREATININE 0.94  CALCIUM 7.9*     Studies/Results: No results found.  Assessment/Plan: -Continue to monitor -Ambulation/ICS -Continue light IVF -Diet as tolerated, encouraged to at least try broth   LOS: 1 day   Star Age 08/19/2014, 4:46 PM

## 2014-08-20 LAB — CREATININE, FLUID (PLEURAL, PERITONEAL, JP DRAINAGE): Creat, Fluid: 0.9 mg/dL

## 2014-08-20 MED ORDER — SENNOSIDES-DOCUSATE SODIUM 8.6-50 MG PO TABS: 1.0000 | ORAL_TABLET | Freq: Two times a day (BID) | ORAL | Status: DC

## 2014-08-20 NOTE — Progress Notes (Signed)
Patient discharged home with belongings, IVs removed, AVS and prescriptions given, teach back performed. Pt wanted to walk down, refused wheelchair. Pt stable at time of discharge.

## 2014-08-20 NOTE — Discharge Summary (Signed)
Physician Discharge Summary  Patient ID: Isaac Ramsey MRN: [id number] DOB/AGE: [birth year] 59 y.o.  Admit date: 08/18/2014 Discharge date: 08/20/2014  Admission Diagnoses:   High Risk Prostate Cancer   Discharge Diagnoses:  Active Problems:   Prostate cancer   Discharged Condition: good  Hospital Course:   1 - High Risk Prostate Cancer - s/p robotic prostatectomy with bilateral pelvic lymphadenectomy on 08/18/14, the day of admission, without acute complication. Admitted to 4th floor Urology service post-op. By POD 2, the day of discharge, he is ambulatory, pain controlled on PO meds, tollerating regular diet, and felt to be adequate for discharge. His JP drain removed POD 2 as output scant and Cr same as serum. Final pathology confirms [id number] Gleason 7 adenocarcinoma with negative margins.   Consults: None  Significant Diagnostic Studies: labs: as per above  Treatments: surgery: robotic prostatectomy with bilateral pelvic lymphadenectomy on 08/18/14  Discharge Exam: Blood pressure 132/83, pulse 95, temperature 98.5 F (36.9 C), temperature source Oral, resp. rate 16, height 6' (1.829 m), weight 66.792 kg (147 lb 4 oz), SpO2 99 %. General appearance: alert, cooperative, appears stated age and wife at bedside Head: Normocephalic, without obvious abnormality, atraumatic Eyes: negative Nose: Nares normal. Septum midline. Mucosa normal. No drainage or sinus tenderness. Throat: lips, mucosa, and tongue normal; teeth and gums normal Neck: supple, symmetrical, trachea midline Back: symmetric, no curvature. ROM normal. No CVA tenderness. Resp: non-labored on room air Cardio: regular rate and rhythm, S1, S2 normal, no murmur, click, rub or gallop GI: soft, non-tender; bowel sounds normal; no masses,  no organomegaly Male genitalia: normal, some penoscrotal ecchymoses w/o hematoma, stable.  Extremities: extremities normal, atraumatic, no cyanosis or edema Pulses: 2+ and  symmetric Skin: Skin color, texture, turgor normal. No rashes or lesions Lymph nodes: Cervical, supraclavicular, and axillary nodes normal. Neurologic: Grossly normal Incision/Wound: recent surgical sites c/d/i. No hernias. Foley c/d/i with yellow urine.   Disposition: Final discharge disposition not confirmed     Medication List    STOP taking these medications        CIALIS 20 MG tablet  Generic drug:  tadalafil     LEVITRA 20 MG tablet  Generic drug:  vardenafil     trimethoprim-polymyxin b ophthalmic solution  Commonly known as:  POLYTRIM      TAKE these medications        HYDROcodone-acetaminophen 5-325 MG per tablet  Commonly known as:  NORCO  Take 1-2 tablets by mouth every 6 (six) hours as needed.     senna-docusate 8.6-50 MG per tablet  Commonly known as:  Senokot-S  Take 1 tablet by mouth 2 (two) times daily. While taking pain meds to prevent constipation     sulfamethoxazole-trimethoprim 800-160 MG per tablet  Commonly known as:  BACTRIM DS,SEPTRA DS  Take 1 tablet by mouth 2 (two) times daily. Start the day prior to foley removal appointment           Follow-up Information    Follow up with Alexis Frock, MD On 08/26/2014.   Specialty:  Urology   Why:  at 9:15   Contact information:   [place]  [id number] [phone]       Signed: Alexis Frock 08/20/2014, 2:40 PM

## 2015-04-01 ENCOUNTER — Encounter: Payer: Self-pay | Admitting: Radiation Oncology

## 2015-04-01 NOTE — Progress Notes (Signed)
GU Location of Tumor / Histology: High risk prostate cancer s/p robotic prostatectomy.  10/2014 PSA   0.04 12/2014 PSA  0.11 03/2015  PSA  0.28  If Prostate Cancer, Gleason Score is (4 + 3) and pre op PSA was (12.03). March 2017 PSA 0.28  Diagnosis 1. Bladder neck margin, posterior bladder neck margin - FIBROVASCULAR CONNECTIVE TISSUE. 2. Fatty tissue, peri prostatic fat - BENIGN ADIPOSE TISSUE. - NO EVIDENCE OF MALIGNANCY. 3. Lymph node, biopsy, right external iliac - ONE BENIGN LYMPH NODE (0/1). 4. Lymph node, biopsy, right obturator - ONE BENIGN LYMPH NODE (0/1). 5. Lymph node, sentinel, biopsy, right internal iliac - ONE BENIGN LYMPH NODE (0/1). 6. Lymph node, biopsy, left external iliac - BENIGN ADIPOSE TISSUE. - NO LYMPH NODE TISSUE OR MALIGNANCY. 7. Lymph node, sentinel, biopsy, left obturator - ONE BENIGN LYMPH NODE (0/1). 8. Prostate, radical resection - PROSTATIC ADENOCARCINOMA, GLEASON'S SCORE 4+3=7 (WHO GROUP III). - MARGINS NOT INVOLVED. - RIGHT AND LEFT SEMINAL VESICLES INVOLVED BY TUMOR - FOCAL EXTRACAPSULAR EXTENSION   Past/Anticipated interventions by urology, if any: biopsy, biopsy, referral for pre op PT, prostatectomy, follow up, referral to radiation oncology  Past/Anticipated interventions by medical oncology, if any: no  Weight changes, if any: no  Bowel/Bladder complaints, if any: leakage only with vigorous activity, erectile dysfunction. Denies dysuria or hematuria.   Nausea/Vomiting, if any: no  Pain issues, if any:  Reports persistent right testicular pain without edema. Reports dull aching joints present since surgery.  SAFETY ISSUES:  Prior radiation? no  Pacemaker/ICD? no  Possible current pregnancy? no  Is the patient on methotrexate? no  Current Complaints / other details:  60 year old male. 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 Denies hot flashes.

## 2015-04-04 ENCOUNTER — Encounter: Payer: Self-pay | Admitting: Radiation Oncology

## 2015-04-04 ENCOUNTER — Ambulatory Visit
Admission: RE | Admit: 2015-04-04 | Discharge: 2015-04-04 | Disposition: A | Discharge status: Home / Self Care | Payer: BLUE CROSS/BLUE SHIELD | Source: Ambulatory Visit | Attending: Radiation Oncology | Admitting: Radiation Oncology

## 2015-04-04 VITALS — BP 114/76 | HR 74 | Resp 16 | Ht 72.0 in | Wt 147.9 lb

## 2015-04-04 DIAGNOSIS — C61 Malignant neoplasm of prostate: Secondary | ICD-10-CM

## 2015-04-04 HISTORY — DX: Malignant neoplasm of prostate: C61

## 2015-04-04 NOTE — Progress Notes (Signed)
See progress note under physician encounter. 

## 2015-04-04 NOTE — Progress Notes (Signed)
Radiation Oncology         (336) [phone] ________________________________  Initial Outpatient Consultation  Name: Isaac Ramsey MRN: [id number]  Date: 04/04/2015  DOB: 1955/02/20  [id number] W, MD  Alexis Frock, MD   REFERRING PHYSICIAN: Alexis Frock, MD  DIAGNOSIS: 60 y.o. 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    ICD-9-CM ICD-10-CM   1. Malignant neoplasm of prostate ([place]) [place] OF PRESENT ILLNESS:Demitrious Ramsey is a 60 y.o. gentleman, originally found to have an elevated PSA of 12.03 by his primary care physician in February 2015.  Accordingly, he was referred for evaluation in urology. The patient proceeded to transrectal ultrasound with 12 biopsies of the prostate on 05/12/2014. Out of 12 core biopsies, 3 were positive.  The maximum Gleason score was 4+4, and this was seen in the right mid section. He had a radical prostatectomy 08/18/2014, revealing extracapsular extension and bilateral seminal vesical involvement and 0/4 positive lymph nodes. In October 2016, his PSA was 0.04, in December 2016 this was 0.11 and as of March 2017, this is again elevated to 0.28.  Due to the rising PSA, he has kindly been referred today for discussion of potential radiation treatment options.   PREVIOUS RADIATION THERAPY: No  PAST MEDICAL HISTORY:  has a past medical history of Chickenpox and Prostate cancer ([place]).    PAST SURGICAL HISTORY: Past Surgical History  Procedure Laterality Date  . Vasectomy    . Prostate biopsy      3'16 -dx. prostate cancer  . Robot assisted laparoscopic radical prostatectomy N/A 08/18/2014    Procedure: ROBOTIC ASSISTED LAPAROSCOPIC RADICAL PROSTATECTOMY WITH INDOCYANINE GREEN DYE;  Surgeon: Alexis Frock, MD;  Location: [facility];  Service: Urology;  Laterality: N/A;  . Lymphadenectomy Bilateral 08/18/2014    Procedure: BILATERAL PELVIC  LYMPHADENECTOMY WITH RIGHT INGUINAL HERNIA REPAIR;  Surgeon: Alexis Frock, MD;  Location: [facility];  Service: Urology;  Laterality: Bilateral;    FAMILY HISTORY:  Family History  Problem Relation Age of Onset  . Hypertension Mother   . Arthritis Mother   . Hyperlipidemia Father   . Hypertension Father   . Heart disease Father 61    CAD  . Cancer Father     bladder  . Alcohol abuse Brother     SOCIAL HISTORY:  reports that he quit smoking about 31 years ago. He has never used smokeless tobacco. He reports that he does not drink alcohol or use illicit drugs. The patient is married and resides in [place]. He has an Chief Financial Officer, and works for [employer]. He does not have any children. He is originally from the [place]: Review of patient's allergies indicates no known allergies.  MEDICATIONS:  Current Outpatient Prescriptions  Medication Sig Dispense Refill  . HYDROcodone-acetaminophen (NORCO) 5-325 MG per tablet Take 1-2 tablets by mouth every 6 (six) hours as needed. 30 tablet 0  . senna-docusate (SENOKOT-S) 8.6-50 MG per tablet Take 1 tablet by mouth 2 (two) times daily. While taking pain meds to prevent constipation 30 tablet 0  . sulfamethoxazole-trimethoprim (BACTRIM DS,SEPTRA DS) 800-160 MG per tablet Take 1 tablet by mouth 2 (two) times daily. Start the day prior to foley removal appointment 6 tablet 0   No current facility-administered medications for this encounter.    REVIEW OF SYSTEMS:  On review of systems, the patient reports that he is doing well overall. He denies  any chest pain, shortness of breath, cough, fevers, chills, night sweats, unintended weight changes. He  denies abdominal pain, nausea or vomiting. His IPSS score was 11 indicating mild urinary outflow obstructive symptoms.  He indicated that his erectile function is not able to complete sexual activity. He is currently using 3 pads a day to manage his urinary incontinence. He denies any difficulty  with bowel dysfunction. A complete review of systems is obtained and is otherwise negative.  PHYSICAL EXAM:    height is 6' (1.829 m) and weight is 147 lb 14.4 oz (67.087 kg). His respiration is 16 and oxygen saturation is 100%.   Pain scale 0/10 In general this is a well appearing Caucasian male in no acute distress. He is alert and oriented x4 and appropriate throughout the examination. HEENT reveals that the patient is normocephalic, atraumatic. EOMs are intact. PERRLA. Skin is intact without any evidence of gross lesions. Cardiovascular exam reveals a regular rate and rhythm, no clicks rubs or murmurs are auscultated. Chest is clear to auscultation bilaterally. Lymphatic assessment is performed and does not reveal any adenopathy in the cervical, supraclavicular, axillary, or inguinal chains. Abdomen has active bowel sounds in all quadrants and is intact. The abdomen is soft, non tender, non distended. Lower extremities are negative for pretibial pitting edema, deep calf tenderness, cyanosis or clubbing.   KPS = 100  100 - Normal; no complaints; no evidence of disease. 90   - Able to carry on normal activity; minor signs or symptoms of disease. 80   - Normal activity with effort; some signs or symptoms of disease. 35   - Cares for self; unable to carry on normal activity or to do active work. 60   - Requires occasional assistance, but is able to care for most of his personal needs. 50   - Requires considerable assistance and frequent medical care. 46   - Disabled; requires special care and assistance. 16   - Severely disabled; hospital admission is indicated although death not imminent. 85   - Very sick; hospital admission necessary; active supportive treatment necessary. 10   - Moribund; fatal processes progressing rapidly. 0     - Dead  Karnofsky DA, Abelmann Garrochales, Craver LS and Burchenal [facility] [phone]) The use of the nitrogen mustards in the palliative treatment of carcinoma: with particular  reference to bronchogenic carcinoma Cancer 1 634-56   LABORATORY DATA:  Lab Results  Component Value Date   WBC 4.9 08/13/2014   HGB 11.3* 08/19/2014   HCT 33.9* 08/19/2014   MCV 89.9 08/13/2014   PLT 181 08/13/2014   Lab Results  Component Value Date   NA 130* 08/19/2014   K 3.7 08/19/2014   CL 98* 08/19/2014   CO2 25 08/19/2014   Lab Results  Component Value Date   ALT 22 09/02/2013   AST 23 09/02/2013   ALKPHOS 56 09/02/2013   BILITOT 1.5* 09/02/2013     RADIOGRAPHY: No results found.  IMPRESSION: 60 year old male with a Gleason 7 adenocarcinoma of the prostate with a PSA of 12, s/p prostatectomy 08/2014, with rising PSA concerning for recurrent disease.  PLAN: Today Dr. Tammi Klippel reviewed the findings and workup thus far.  We discussed the natural history of prostate cancer.  We reviewed the the implications of T-stage, Gleason's Score, and PSA on decision-making and outcomes in prostate cancer. We compared and contrasted the risks of androgen depravation therapy in conjunction with external beam radiotherapy to the prostatic fossa.  Dr. Tammi Klippel  filled out a patient counseling form for him with relevant treatment diagrams and we retained a copy for our records.  After discussion of treatment options, the patient would like to proceed with prostate IMRT.   In discussing ADT, the patient elects for postponing radiation therapy with the use of hormone therapy so he is able to strengthen his pelvic floor before treatment since he is concerned about his urinary incontinence.  He would like to discuss these recommendations another time with Dr. Tresa Moore before starting treatment. If he decides to go forward with hormone therapy, we would start radiation treatment in June. If he decides not to do hormone therapy, we will schedule a planning appointment to start radiation therapy in the near future.  We enjoyed meeting with him today, and will look forward to participating in the care of  this very nice gentleman.   The above documentation reflects my direct findings during this shared patient visit. Please see the separate note by Dr. Tammi Klippel on this date for the remainder of the patient's plan of care.    Carola Rhine, PAC    This document serves as a record of services personally performed by Shona Simpson, PAC and [name], MD. It was created on their behalf by Lendon Collar, a trained medical scribe. The creation of this record is based on the scribe's personal observations and the provider's statements to them. This document has been checked and approved by the attending provider.

## 2015-04-07 NOTE — Addendum Note (Signed)
Encounter addended by: Heywood Footman, RN on: 04/07/2015  4:30 PM<BR>     Documentation filed: Notes Section

## 2015-04-07 NOTE — Progress Notes (Signed)
ASSESSING CLINICAL PROGRESSION IN ADVANCED PROSTATE CANCER  04/04/15  1. Do you feel tired, even after you rest or sleep 2  2. Have you experienced any changes in your eating habits since your last visit 0  3. Has fatigue, or feeling tired despite rest, affected your normal daily activities 1  4. Have you felt any weakness in your arms and legs recently that has caused difficulty with normal task, such as lifting items, or standing up from a chair? 0  5. In the past month, have you experienced difficulty moving or doing normal activities, such as walking or climbing stairs? Yes  5b. In the past month have you experienced missed work, activities, and/or social events due to how you are feeling? Yes  6. Do you ever feel short of breathe or weak, even during normal activities 0  7. Have you felt any numbness of tingling in your body since your last visit? (e.g. Fingers/hands/toes/feet/,legs/arms) 0  8. Have you had any changes in your bladder or bowel control in the past month? No  9. Have you been feeling pain or discomfort? 2  10. In the past month, have you had any discomfort that made it difficult to fall asleep or stay asleep? 0  11. Are you taking any medication to relieve your discomfort or pain? No

## 2015-06-24 ENCOUNTER — Telehealth: Payer: Self-pay | Admitting: Radiation Oncology

## 2015-06-24 NOTE — Telephone Encounter (Signed)
I called the patient to discuss is interested in moving forward with radiotherapy. He received his Lupron injection on 04/20/2015, and is interested in moving forward with his external radiotherapy to the prostatic fossa. Simulation appointment is provided to the patient for Thursday, June 29 at 8 AM.

## 2015-06-30 ENCOUNTER — Ambulatory Visit
Admission: RE | Admit: 2015-06-30 | Discharge: 2015-06-30 | Disposition: A | Discharge status: Home / Self Care | Payer: BLUE CROSS/BLUE SHIELD | Source: Ambulatory Visit | Attending: Radiation Oncology | Admitting: Radiation Oncology

## 2015-06-30 DIAGNOSIS — C61 Malignant neoplasm of prostate: Secondary | ICD-10-CM | POA: Diagnosis not present

## 2015-06-30 NOTE — Progress Notes (Signed)
  Radiation Oncology         (336) [phone] ________________________________  Name: Isaac Ramsey MRN: [id number]  Date: 06/30/2015  DOB: March 20, 1955  SIMULATION AND TREATMENT PLANNING NOTE    ICD-9-CM ICD-10-CM   1. Prostate cancer ([place]) 185 C61     DIAGNOSIS:  60 y.o. 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  NARRATIVE:  The patient was brought to the [place].  Identity was confirmed.  All relevant records and images related to the planned course of therapy were reviewed.  The patient freely provided informed written consent to proceed with treatment after reviewing the details related to the planned course of therapy. The consent form was witnessed and verified by the simulation staff.  Then, the patient was set-up in a stable reproducible supine position for radiation therapy.  A vacuum lock pillow device was custom fabricated to position his legs in a reproducible immobilized position.  Then, I performed a urethrogram under sterile conditions to identify the prostatic apex.  CT images were obtained.  Surface markings were placed.  The CT images were loaded into the planning software.  Then the prostate target and avoidance structures including the rectum, bladder, bowel and hips were contoured.  Treatment planning then occurred.  The radiation prescription was entered and confirmed.  A total of one complex treatment devices were fabricated. I have requested : Intensity Modulated Radiotherapy (IMRT) is medically necessary for this case for the following reason:  Rectal sparing.Marland Kitchen  PLAN:  The patient will receive 68.4 Gy in 38 fractions.  ________________________________  Sheral Apley Tammi Klippel, M.D.

## 2015-07-01 DIAGNOSIS — C61 Malignant neoplasm of prostate: Secondary | ICD-10-CM | POA: Diagnosis not present

## 2015-07-12 ENCOUNTER — Ambulatory Visit
Admission: RE | Admit: 2015-07-12 | Discharge: 2015-07-12 | Disposition: A | Discharge status: Home / Self Care | Payer: BLUE CROSS/BLUE SHIELD | Source: Ambulatory Visit | Attending: Radiation Oncology | Admitting: Radiation Oncology

## 2015-07-12 ENCOUNTER — Ambulatory Visit: Payer: BLUE CROSS/BLUE SHIELD | Admitting: Radiation Oncology

## 2015-07-12 DIAGNOSIS — C61 Malignant neoplasm of prostate: Secondary | ICD-10-CM | POA: Diagnosis not present

## 2015-07-13 ENCOUNTER — Ambulatory Visit: Payer: BLUE CROSS/BLUE SHIELD

## 2015-07-13 ENCOUNTER — Ambulatory Visit
Admission: RE | Admit: 2015-07-13 | Discharge: 2015-07-13 | Disposition: A | Discharge status: Home / Self Care | Payer: BLUE CROSS/BLUE SHIELD | Source: Ambulatory Visit | Attending: Radiation Oncology | Admitting: Radiation Oncology

## 2015-07-13 DIAGNOSIS — C61 Malignant neoplasm of prostate: Secondary | ICD-10-CM | POA: Diagnosis not present

## 2015-07-14 ENCOUNTER — Ambulatory Visit
Admission: RE | Admit: 2015-07-14 | Discharge: 2015-07-14 | Disposition: A | Discharge status: Home / Self Care | Payer: BLUE CROSS/BLUE SHIELD | Source: Ambulatory Visit | Attending: Radiation Oncology | Admitting: Radiation Oncology

## 2015-07-14 ENCOUNTER — Ambulatory Visit: Payer: BLUE CROSS/BLUE SHIELD

## 2015-07-14 DIAGNOSIS — C61 Malignant neoplasm of prostate: Secondary | ICD-10-CM | POA: Diagnosis not present

## 2015-07-15 ENCOUNTER — Ambulatory Visit
Admission: RE | Admit: 2015-07-15 | Discharge: 2015-07-15 | Disposition: A | Discharge status: Home / Self Care | Payer: BLUE CROSS/BLUE SHIELD | Source: Ambulatory Visit | Attending: Radiation Oncology | Admitting: Radiation Oncology

## 2015-07-15 ENCOUNTER — Ambulatory Visit: Payer: BLUE CROSS/BLUE SHIELD

## 2015-07-15 VITALS — BP 119/75 | HR 73 | Resp 16 | Wt 153.2 lb

## 2015-07-15 DIAGNOSIS — C61 Malignant neoplasm of prostate: Secondary | ICD-10-CM

## 2015-07-15 NOTE — Progress Notes (Signed)
  Radiation Oncology         [phone]   Name: Isaac Ramsey MRN: [id number]   Date: 07/15/2015  DOB: [birth year]     Weekly Radiation Therapy Management    ICD-9-CM ICD-10-CM   1. Prostate cancer ([place]) 185 C61     Current Dose: 7.2 Gy  Planned Dose:  68.4 Gy  Narrative The patient presents for routine under treatment assessment.  Weight and vitals stable. Denies pain. Reports nocturia x 1-2. Denies dysuria or hematuria. Steady urine stream without difficulty emptying. Reports urgency. Reports urinary frequency during the day. Reports leakage. Reports wearing three pads per day. Denies diarrhea. Reports taking miralax and eating prunes to resolve constipation. Reports he is very concerned about migraines associated with constipation. Denies fatigue.  The patient is without complaint. Set-up films were reviewed. The chart was checked.  Physical Findings  weight is 153 lb 3.2 oz (69.491 kg). His blood pressure is 119/75 and his pulse is 73. His respiration is 16 and oxygen saturation is 100%. . Weight essentially stable.  No significant changes. Alert and in no acute distress.  Impression The patient is tolerating radiation.  Plan Continue treatment as planned. He was given a Radiation and You booklet today.         Sheral Apley Tammi Klippel, M.D.  This document serves as a record of services personally performed by [name], MD. It was created on his behalf by Arlyce Harman, a trained medical scribe. The creation of this record is based on the scribe's personal observations and the provider's statements to them. This document has been checked and approved by the attending provider.

## 2015-07-15 NOTE — Progress Notes (Signed)
Weight and vitals stable. Denies pain. Reports nocturia x 1-2. Denies dysuria or hematuria. Steady urine stream without difficulty emptying. Reports urgency. Reports urinary frequency during the day. Reports leakage. Reports wearing three pads per day.  Denies diarrhea. Reports taking miralax and eating prunes to resolve constipation. Reports he is very concerned about migraines associated with constipation. Denies fatigue.   BP 119/75 mmHg  Pulse 73  Resp 16  Wt 153 lb 3.2 oz (69.491 kg)  SpO2 100% Wt Readings from Last 3 Encounters:  07/15/15 153 lb 3.2 oz (69.491 kg)  04/01/15 147 lb 14.4 oz (67.087 kg)  08/18/14 147 lb 4 oz (66.792 kg)

## 2015-07-18 ENCOUNTER — Ambulatory Visit
Admission: RE | Admit: 2015-07-18 | Discharge: 2015-07-18 | Disposition: A | Discharge status: Home / Self Care | Payer: BLUE CROSS/BLUE SHIELD | Source: Ambulatory Visit | Attending: Radiation Oncology | Admitting: Radiation Oncology

## 2015-07-18 ENCOUNTER — Ambulatory Visit: Payer: BLUE CROSS/BLUE SHIELD

## 2015-07-18 DIAGNOSIS — C61 Malignant neoplasm of prostate: Secondary | ICD-10-CM | POA: Diagnosis not present

## 2015-07-19 ENCOUNTER — Ambulatory Visit
Admission: RE | Admit: 2015-07-19 | Discharge: 2015-07-19 | Disposition: A | Discharge status: Home / Self Care | Payer: BLUE CROSS/BLUE SHIELD | Source: Ambulatory Visit | Attending: Radiation Oncology | Admitting: Radiation Oncology

## 2015-07-19 ENCOUNTER — Ambulatory Visit: Payer: BLUE CROSS/BLUE SHIELD

## 2015-07-19 DIAGNOSIS — C61 Malignant neoplasm of prostate: Secondary | ICD-10-CM | POA: Diagnosis not present

## 2015-07-20 ENCOUNTER — Ambulatory Visit
Admission: RE | Admit: 2015-07-20 | Discharge: 2015-07-20 | Disposition: A | Discharge status: Home / Self Care | Payer: BLUE CROSS/BLUE SHIELD | Source: Ambulatory Visit | Attending: Radiation Oncology | Admitting: Radiation Oncology

## 2015-07-20 ENCOUNTER — Ambulatory Visit: Payer: BLUE CROSS/BLUE SHIELD

## 2015-07-20 DIAGNOSIS — C61 Malignant neoplasm of prostate: Secondary | ICD-10-CM | POA: Diagnosis not present

## 2015-07-21 ENCOUNTER — Ambulatory Visit
Admission: RE | Admit: 2015-07-21 | Discharge: 2015-07-21 | Disposition: A | Discharge status: Home / Self Care | Payer: BLUE CROSS/BLUE SHIELD | Source: Ambulatory Visit | Attending: Radiation Oncology | Admitting: Radiation Oncology

## 2015-07-21 ENCOUNTER — Ambulatory Visit: Payer: BLUE CROSS/BLUE SHIELD

## 2015-07-21 ENCOUNTER — Encounter: Payer: Self-pay | Admitting: Radiation Oncology

## 2015-07-21 VITALS — BP 124/90 | HR 86 | Resp 16 | Wt 152.9 lb

## 2015-07-21 DIAGNOSIS — C61 Malignant neoplasm of prostate: Secondary | ICD-10-CM | POA: Diagnosis not present

## 2015-07-21 NOTE — Progress Notes (Signed)
  Radiation Oncology         [phone]   Name: Isaac Ramsey MRN: [id number]   Date: 07/21/2015  DOB: [birth year]     Weekly Radiation Therapy Management    ICD-9-CM ICD-10-CM   1. Prostate cancer ([place]) 185 C61     Current Dose: 14.4 Gy  Planned Dose:  68.4 Gy  Narrative The patient presents for routine under treatment assessment.  Weight and vitals stable. Denies pain. Reports nocturia x 1-2. Denies dysuria or hematuria. Steady urine stream without difficulty emptying. Reports urgency. Reports urinary frequency during the day. Reports leakage. Reports wearing three pads per day. Reports a "twinge" of diarrhea. Reports mild fatigue.  Set-up films were reviewed. The chart was checked.  Physical Findings  weight is 152 lb 14.4 oz (69.4 kg). His blood pressure is 124/90 and his pulse is 86. His respiration is 16 and oxygen saturation is 100%. . Weight essentially stable.  No significant changes. Alert and in no acute distress.  Impression The patient is tolerating radiation.  Plan Continue treatment as planned. I advised the patient to Imodium AD prn for his diarrhea.    Sheral Apley Tammi Klippel, M.D.  This document serves as a record of services personally performed by [name], MD. It was created on his behalf by Darcus Austin, a trained medical scribe. The creation of this record is based on the scribe's personal observations and the provider's statements to them. This document has been checked and approved by the attending provider.

## 2015-07-21 NOTE — Progress Notes (Signed)
Weight and vitals stable. Denies pain. Reports nocturia x 1-2. Denies dysuria or hematuria. Steady urine stream without difficulty emptying. Reports urgency. Reports urinary frequency during the day. Reports leakage. Reports wearing three pads per day. Reports one episode of diarrhea. Reports mild fatigue.   BP 124/90 mmHg  Pulse 86  Resp 16  Wt 152 lb 14.4 oz (69.355 kg)  SpO2 100% Wt Readings from Last 3 Encounters:  07/21/15 152 lb 14.4 oz (69.355 kg)  07/15/15 153 lb 3.2 oz (69.491 kg)  04/01/15 147 lb 14.4 oz (67.087 kg)

## 2015-07-22 ENCOUNTER — Ambulatory Visit
Admission: RE | Admit: 2015-07-22 | Discharge: 2015-07-22 | Disposition: A | Discharge status: Home / Self Care | Payer: BLUE CROSS/BLUE SHIELD | Source: Ambulatory Visit | Attending: Radiation Oncology | Admitting: Radiation Oncology

## 2015-07-22 ENCOUNTER — Ambulatory Visit: Payer: BLUE CROSS/BLUE SHIELD

## 2015-07-22 DIAGNOSIS — C61 Malignant neoplasm of prostate: Secondary | ICD-10-CM | POA: Diagnosis not present

## 2015-07-25 ENCOUNTER — Ambulatory Visit
Admission: RE | Admit: 2015-07-25 | Discharge: 2015-07-25 | Disposition: A | Discharge status: Home / Self Care | Payer: BLUE CROSS/BLUE SHIELD | Source: Ambulatory Visit | Attending: Radiation Oncology | Admitting: Radiation Oncology

## 2015-07-25 ENCOUNTER — Ambulatory Visit: Payer: BLUE CROSS/BLUE SHIELD

## 2015-07-25 DIAGNOSIS — C61 Malignant neoplasm of prostate: Secondary | ICD-10-CM | POA: Diagnosis not present

## 2015-07-26 ENCOUNTER — Ambulatory Visit: Payer: BLUE CROSS/BLUE SHIELD

## 2015-07-26 ENCOUNTER — Ambulatory Visit
Admission: RE | Admit: 2015-07-26 | Discharge: 2015-07-26 | Disposition: A | Discharge status: Home / Self Care | Payer: BLUE CROSS/BLUE SHIELD | Source: Ambulatory Visit | Attending: Radiation Oncology | Admitting: Radiation Oncology

## 2015-07-26 DIAGNOSIS — C61 Malignant neoplasm of prostate: Secondary | ICD-10-CM | POA: Diagnosis not present

## 2015-07-27 ENCOUNTER — Ambulatory Visit: Payer: BLUE CROSS/BLUE SHIELD

## 2015-07-27 ENCOUNTER — Ambulatory Visit
Admission: RE | Admit: 2015-07-27 | Discharge: 2015-07-27 | Disposition: A | Discharge status: Home / Self Care | Payer: BLUE CROSS/BLUE SHIELD | Source: Ambulatory Visit | Attending: Radiation Oncology | Admitting: Radiation Oncology

## 2015-07-27 DIAGNOSIS — C61 Malignant neoplasm of prostate: Secondary | ICD-10-CM | POA: Diagnosis not present

## 2015-07-28 ENCOUNTER — Ambulatory Visit
Admission: RE | Admit: 2015-07-28 | Discharge: 2015-07-28 | Disposition: A | Discharge status: Home / Self Care | Payer: BLUE CROSS/BLUE SHIELD | Source: Ambulatory Visit | Attending: Radiation Oncology | Admitting: Radiation Oncology

## 2015-07-28 ENCOUNTER — Ambulatory Visit: Payer: BLUE CROSS/BLUE SHIELD

## 2015-07-28 DIAGNOSIS — C61 Malignant neoplasm of prostate: Secondary | ICD-10-CM | POA: Diagnosis not present

## 2015-07-29 ENCOUNTER — Ambulatory Visit
Admission: RE | Admit: 2015-07-29 | Discharge: 2015-07-29 | Disposition: A | Discharge status: Home / Self Care | Payer: BLUE CROSS/BLUE SHIELD | Source: Ambulatory Visit | Attending: Radiation Oncology | Admitting: Radiation Oncology

## 2015-07-29 ENCOUNTER — Ambulatory Visit: Payer: BLUE CROSS/BLUE SHIELD

## 2015-07-29 VITALS — BP 120/80 | HR 74 | Resp 16 | Wt 151.8 lb

## 2015-07-29 DIAGNOSIS — C61 Malignant neoplasm of prostate: Secondary | ICD-10-CM | POA: Diagnosis not present

## 2015-07-29 NOTE — Progress Notes (Signed)
Weight and vitals stable. Denies pain. Reports nocturia x 5-6. Reports leakage is less. Reports he has only had to use 1-2 pads per day most recently. Reports moderate fatigue. Reports headaches every other day this week. Reports constipation has  Improved. Denies dysuria or hematuria. Reports urinary frequency and urgency are only present at night. Reports he feels like he has more control over his bladder when standing or sitting. Reports great urgency when he lays flat.   BP 120/80 (BP Location: Left Arm, Patient Position: Sitting, Cuff Size: Normal)   Pulse 74   Resp 16   Wt 151 lb 12.8 oz (68.9 kg)   SpO2 99%   BMI 20.59 kg/m  Wt Readings from Last 3 Encounters:  07/29/15 151 lb 12.8 oz (68.9 kg)  07/21/15 152 lb 14.4 oz (69.4 kg)  07/15/15 153 lb 3.2 oz (69.5 kg)

## 2015-07-29 NOTE — Progress Notes (Signed)
  Radiation Oncology         [phone]   Name: Isaac Ramsey MRN: [id number]   Date: 07/29/2015  DOB: September 03, 1955   Weekly Radiation Therapy Management    ICD-9-CM ICD-10-CM   1. Prostate cancer ([place]) 185 C61     Current Dose: 25.2 Gy  Planned Dose:  68.4 Gy  Narrative The patient presents for routine under treatment assessment. Weight and vitals stable. Denies pain. Reports nocturia x 5-6. Reports leakage is less. Reports he has only had to use 1-2 pads per day most recently. Reports moderate fatigue. Reports headaches every other day this week. Reports constipation has  Improved. Denies dysuria or hematuria. Reports urinary frequency and urgency are only present at night. Reports he feels like he has more control over his bladder when standing or sitting. Reports great urgency when he lays flat.    The patient is without complaint. Set-up films were reviewed. The chart was checked.  Physical Findings  weight is 151 lb 12.8 oz (68.9 kg). His blood pressure is 120/80 and his pulse is 74. His respiration is 16 and oxygen saturation is 99%. . Weight essentially stable.  No significant changes.  Impression The patient is tolerating radiation.  Plan Continue treatment as planned.         Sheral Apley Tammi Klippel, M.D.  This document serves as a record of services personally performed by [name], MD. It was created on his behalf by Truddie Hidden, a trained medical scribe. The creation of this record is based on the scribe's personal observations and the provider's statements to them. This document has been checked and approved by the attending provider.

## 2015-08-01 ENCOUNTER — Ambulatory Visit: Payer: BLUE CROSS/BLUE SHIELD

## 2015-08-01 ENCOUNTER — Ambulatory Visit
Admission: RE | Admit: 2015-08-01 | Discharge: 2015-08-01 | Disposition: A | Discharge status: Home / Self Care | Payer: BLUE CROSS/BLUE SHIELD | Source: Ambulatory Visit | Attending: Radiation Oncology | Admitting: Radiation Oncology

## 2015-08-01 DIAGNOSIS — C61 Malignant neoplasm of prostate: Secondary | ICD-10-CM | POA: Diagnosis not present

## 2015-08-02 ENCOUNTER — Ambulatory Visit: Payer: BLUE CROSS/BLUE SHIELD

## 2015-08-02 ENCOUNTER — Ambulatory Visit
Admission: RE | Admit: 2015-08-02 | Discharge: 2015-08-02 | Disposition: A | Discharge status: Home / Self Care | Payer: BLUE CROSS/BLUE SHIELD | Source: Ambulatory Visit | Attending: Radiation Oncology | Admitting: Radiation Oncology

## 2015-08-02 DIAGNOSIS — C61 Malignant neoplasm of prostate: Secondary | ICD-10-CM | POA: Diagnosis present

## 2015-08-03 ENCOUNTER — Ambulatory Visit: Payer: BLUE CROSS/BLUE SHIELD

## 2015-08-03 ENCOUNTER — Ambulatory Visit
Admission: RE | Admit: 2015-08-03 | Discharge: 2015-08-03 | Disposition: A | Discharge status: Home / Self Care | Payer: BLUE CROSS/BLUE SHIELD | Source: Ambulatory Visit | Attending: Radiation Oncology | Admitting: Radiation Oncology

## 2015-08-03 DIAGNOSIS — C61 Malignant neoplasm of prostate: Secondary | ICD-10-CM | POA: Diagnosis not present

## 2015-08-04 ENCOUNTER — Ambulatory Visit: Payer: BLUE CROSS/BLUE SHIELD

## 2015-08-04 ENCOUNTER — Ambulatory Visit
Admission: RE | Admit: 2015-08-04 | Discharge: 2015-08-04 | Disposition: A | Discharge status: Home / Self Care | Payer: BLUE CROSS/BLUE SHIELD | Source: Ambulatory Visit | Attending: Radiation Oncology | Admitting: Radiation Oncology

## 2015-08-04 DIAGNOSIS — C61 Malignant neoplasm of prostate: Secondary | ICD-10-CM | POA: Diagnosis not present

## 2015-08-05 ENCOUNTER — Ambulatory Visit: Payer: BLUE CROSS/BLUE SHIELD

## 2015-08-05 ENCOUNTER — Ambulatory Visit
Admission: RE | Admit: 2015-08-05 | Discharge: 2015-08-05 | Disposition: A | Discharge status: Home / Self Care | Payer: BLUE CROSS/BLUE SHIELD | Source: Ambulatory Visit | Attending: Radiation Oncology | Admitting: Radiation Oncology

## 2015-08-05 VITALS — BP 126/91 | HR 81 | Wt 151.2 lb

## 2015-08-05 DIAGNOSIS — C61 Malignant neoplasm of prostate: Secondary | ICD-10-CM | POA: Diagnosis not present

## 2015-08-05 NOTE — Progress Notes (Signed)
  Radiation Oncology         [phone]   Name: Isaac Ramsey MRN: [id number]   Date: 08/05/2015  DOB: [birth year]     Weekly Radiation Therapy Management    ICD-9-CM ICD-10-CM   1. Prostate cancer ([place]) 185 C61     Current Dose: 34.2 Gy  Planned Dose:  68.4 Gy  Narrative The patient presents for routine under treatment assessment.  Weight and vitals stable. Denies pain. Reports nocturia x 5-6. Reports leakage is less. Reports he has only had to use 1-2 pads per day most recently. Reports moderate fatigue. Denies dysuria or hematuria. Reports diarrhea. He mentions it does not bother him and does not want to take anything for it at this time.   The patient is without complaint. Set-up films were reviewed. The chart was checked.  Physical Findings  weight is 151 lb 3.2 oz (68.6 kg). His blood pressure is 126/91 (abnormal) and his pulse is 81. . Weight essentially stable.  No significant changes.  Impression The patient is tolerating radiation.  Plan Continue treatment as planned.         Sheral Apley Tammi Klippel, M.D.    This document serves as a record of services personally performed by [name], MD. It was created on his behalf by Lendon Collar, a trained medical scribe. The creation of this record is based on the scribe's personal observations and the provider's statements to them. This document has been checked and approved by the attending provider.

## 2015-08-05 NOTE — Progress Notes (Signed)
Weight and vitals stable. Denies pain. Reports nocturia x 5-6. Reports leakage is less. Reports he has only had to use 1-2 pads per day most recently. Reports moderate fatigue. Denies dysuria or hematuria. Reports diarrhea.   BP (!) 126/91   Pulse 81   Wt 151 lb 3.2 oz (68.6 kg)   BMI 20.51 kg/m  Wt Readings from Last 3 Encounters:  08/05/15 151 lb 3.2 oz (68.6 kg)  07/29/15 151 lb 12.8 oz (68.9 kg)  07/21/15 152 lb 14.4 oz (69.4 kg)

## 2015-08-08 ENCOUNTER — Ambulatory Visit: Payer: BLUE CROSS/BLUE SHIELD

## 2015-08-08 ENCOUNTER — Ambulatory Visit
Admission: RE | Admit: 2015-08-08 | Discharge: 2015-08-08 | Disposition: A | Discharge status: Home / Self Care | Payer: BLUE CROSS/BLUE SHIELD | Source: Ambulatory Visit | Attending: Radiation Oncology | Admitting: Radiation Oncology

## 2015-08-08 DIAGNOSIS — C61 Malignant neoplasm of prostate: Secondary | ICD-10-CM | POA: Diagnosis not present

## 2015-08-09 ENCOUNTER — Ambulatory Visit: Payer: BLUE CROSS/BLUE SHIELD

## 2015-08-09 ENCOUNTER — Ambulatory Visit
Admission: RE | Admit: 2015-08-09 | Discharge: 2015-08-09 | Disposition: A | Discharge status: Home / Self Care | Payer: BLUE CROSS/BLUE SHIELD | Source: Ambulatory Visit | Attending: Radiation Oncology | Admitting: Radiation Oncology

## 2015-08-09 DIAGNOSIS — C61 Malignant neoplasm of prostate: Secondary | ICD-10-CM | POA: Diagnosis not present

## 2015-08-10 ENCOUNTER — Ambulatory Visit: Payer: BLUE CROSS/BLUE SHIELD

## 2015-08-10 ENCOUNTER — Ambulatory Visit
Admission: RE | Admit: 2015-08-10 | Discharge: 2015-08-10 | Disposition: A | Discharge status: Home / Self Care | Payer: BLUE CROSS/BLUE SHIELD | Source: Ambulatory Visit | Attending: Radiation Oncology | Admitting: Radiation Oncology

## 2015-08-10 VITALS — BP 121/78 | HR 94 | Resp 16 | Wt 151.2 lb

## 2015-08-10 DIAGNOSIS — C61 Malignant neoplasm of prostate: Secondary | ICD-10-CM | POA: Diagnosis not present

## 2015-08-10 NOTE — Progress Notes (Signed)
  Radiation Oncology         [phone]) [phone] ________________________________  Name: Isaac Ramsey MRN: [id number]  Date: 08/10/2015  DOB: 10-10-1955  Weekly Radiation Therapy Management    ICD-9-CM ICD-10-CM   1. Prostate cancer ([place]) 185 C61      Current Dose: 39.6 Gy     Planned Dose:  68.4 Gy  Narrative . . . . . . . . The patient presents for routine under treatment assessment.                                  Denies pain. Reports nocturia x 5-6. Reports leakage is less. Reports he has only had to use 1-2 pads per day most recently. Reports moderate fatigue. Denies dysuria or hematuria. Reports diarrhea.                                  Set-up films were reviewed.                                 The chart was checked. Physical Findings. . .  weight is 151 lb 3.2 oz (68.6 kg). His blood pressure is 121/78 and his pulse is 94. His respiration is 16 and oxygen saturation is 100%. . Lungs are clear to auscultation bilaterally. Heart has regular rate and rhythm. No palpable cervical, supraclavicular, or axillary adenopathy. Abdomen soft, non-tender, normal bowel sounds.  Impression . . . . . . . The patient is tolerating radiation. Plan . . . . . . . . . . . . Continue treatment as planned.  ________________________________   Blair Promise, PhD, MD

## 2015-08-10 NOTE — Progress Notes (Signed)
Weight and vitals stable. Denies pain. Reports nocturia x 5-6. Reports leakage is less. Reports he has only had to use 1-2 pads per day most recently. Reports moderate fatigue. Denies dysuria or hematuria. Reports diarrhea.   BP 121/78   Pulse 94   Resp 16   Wt 151 lb 3.2 oz (68.6 kg)   SpO2 100%   BMI 20.51 kg/m  Wt Readings from Last 3 Encounters:  08/10/15 151 lb 3.2 oz (68.6 kg)  08/05/15 151 lb 3.2 oz (68.6 kg)  07/29/15 151 lb 12.8 oz (68.9 kg)

## 2015-08-11 ENCOUNTER — Ambulatory Visit
Admission: RE | Admit: 2015-08-11 | Discharge: 2015-08-11 | Disposition: A | Discharge status: Home / Self Care | Payer: BLUE CROSS/BLUE SHIELD | Source: Ambulatory Visit | Attending: Radiation Oncology | Admitting: Radiation Oncology

## 2015-08-11 ENCOUNTER — Ambulatory Visit: Payer: BLUE CROSS/BLUE SHIELD

## 2015-08-11 DIAGNOSIS — C61 Malignant neoplasm of prostate: Secondary | ICD-10-CM | POA: Diagnosis not present

## 2015-08-12 ENCOUNTER — Ambulatory Visit
Admission: RE | Admit: 2015-08-12 | Discharge: 2015-08-12 | Disposition: A | Discharge status: Home / Self Care | Payer: BLUE CROSS/BLUE SHIELD | Source: Ambulatory Visit | Attending: Radiation Oncology | Admitting: Radiation Oncology

## 2015-08-12 ENCOUNTER — Ambulatory Visit: Payer: BLUE CROSS/BLUE SHIELD

## 2015-08-12 DIAGNOSIS — C61 Malignant neoplasm of prostate: Secondary | ICD-10-CM | POA: Diagnosis not present

## 2015-08-15 ENCOUNTER — Ambulatory Visit
Admission: RE | Admit: 2015-08-15 | Discharge: 2015-08-15 | Disposition: A | Discharge status: Home / Self Care | Payer: BLUE CROSS/BLUE SHIELD | Source: Ambulatory Visit | Attending: Radiation Oncology | Admitting: Radiation Oncology

## 2015-08-15 ENCOUNTER — Ambulatory Visit: Payer: BLUE CROSS/BLUE SHIELD

## 2015-08-15 DIAGNOSIS — C61 Malignant neoplasm of prostate: Secondary | ICD-10-CM | POA: Diagnosis not present

## 2015-08-16 ENCOUNTER — Ambulatory Visit: Payer: BLUE CROSS/BLUE SHIELD

## 2015-08-16 ENCOUNTER — Ambulatory Visit
Admission: RE | Admit: 2015-08-16 | Discharge: 2015-08-16 | Disposition: A | Discharge status: Home / Self Care | Payer: BLUE CROSS/BLUE SHIELD | Source: Ambulatory Visit | Attending: Radiation Oncology | Admitting: Radiation Oncology

## 2015-08-16 DIAGNOSIS — C61 Malignant neoplasm of prostate: Secondary | ICD-10-CM | POA: Diagnosis not present

## 2015-08-17 ENCOUNTER — Ambulatory Visit
Admission: RE | Admit: 2015-08-17 | Discharge: 2015-08-17 | Disposition: A | Discharge status: Home / Self Care | Payer: BLUE CROSS/BLUE SHIELD | Source: Ambulatory Visit | Attending: Radiation Oncology | Admitting: Radiation Oncology

## 2015-08-17 ENCOUNTER — Ambulatory Visit: Payer: BLUE CROSS/BLUE SHIELD

## 2015-08-17 DIAGNOSIS — C61 Malignant neoplasm of prostate: Secondary | ICD-10-CM | POA: Diagnosis not present

## 2015-08-18 ENCOUNTER — Ambulatory Visit: Payer: BLUE CROSS/BLUE SHIELD

## 2015-08-18 ENCOUNTER — Ambulatory Visit
Admission: RE | Admit: 2015-08-18 | Discharge: 2015-08-18 | Disposition: A | Discharge status: Home / Self Care | Payer: BLUE CROSS/BLUE SHIELD | Source: Ambulatory Visit | Attending: Radiation Oncology | Admitting: Radiation Oncology

## 2015-08-18 DIAGNOSIS — C61 Malignant neoplasm of prostate: Secondary | ICD-10-CM | POA: Diagnosis not present

## 2015-08-19 ENCOUNTER — Ambulatory Visit: Payer: BLUE CROSS/BLUE SHIELD

## 2015-08-19 ENCOUNTER — Ambulatory Visit
Admission: RE | Admit: 2015-08-19 | Discharge: 2015-08-19 | Disposition: A | Discharge status: Home / Self Care | Payer: BLUE CROSS/BLUE SHIELD | Source: Ambulatory Visit | Attending: Radiation Oncology | Admitting: Radiation Oncology

## 2015-08-19 VITALS — BP 109/82 | HR 87 | Resp 16 | Wt 152.6 lb

## 2015-08-19 DIAGNOSIS — C61 Malignant neoplasm of prostate: Secondary | ICD-10-CM | POA: Diagnosis not present

## 2015-08-19 NOTE — Progress Notes (Signed)
  Radiation Oncology         [phone]   Name: Isaac Ramsey MRN: [id number]   Date: 08/19/2015  DOB: January 31, 1955     Weekly Radiation Therapy Management    ICD-9-CM ICD-10-CM   1. Prostate cancer ([place]) 185 C61     Current Dose: 52.2 Gy  Planned Dose:  68.4 Gy  Narrative The patient presents for routine under treatment assessment.  Weight and vitals stable. Denies pain. Reports nocturia x 4-5 less than 6. Reports leakage is less during in the day. Reports he has only had to use 1-2 pads per day most recently. Denies dysuria or hematuria. Reports diarrhea has increased, though it is more a nuisance than a problem. Reports moderate fatigue. Reports in recent days he has had difficulty sleeping. The last 2 nights he has slept well. He believes the difficulty sleeping could be anxiety related to work. Denies any recent migraines. Reports hot flashes.   The patient is without complaint. Set-up films were reviewed. The chart was checked.  Physical Findings  weight is 152 lb 9.6 oz (69.2 kg). His blood pressure is 109/82 and his pulse is 87. His respiration is 16 and oxygen saturation is 100%. . Weight essentially stable.  No significant changes. Alert, in no acute distress.  Impression The patient is tolerating radiation.  Plan Continue treatment as planned.         Sheral Apley Tammi Klippel, M.D.    This document serves as a record of services personally performed by [name], MD. It was created on his behalf by Arlyce Harman, a trained medical scribe. The creation of this record is based on the scribe's personal observations and the provider's statements to them. This document has been checked and approved by the attending provider.

## 2015-08-19 NOTE — Progress Notes (Signed)
Weight and vitals stable. Denies pain. Reports nocturia x 4-5 less than 6. Reports leakage is less during in the day. Reports he has only had to use 1-2 pads per day most recently. Denies dysuria or hematuria. Reports diarrhea. Reports moderate fatigue. Reports in recent days he has had difficulty sleeping. Denies any recent migraines.  BP 109/82 (BP Location: Left Arm, Patient Position: Sitting, Cuff Size: Normal)   Pulse 87   Resp 16   Wt 152 lb 9.6 oz (69.2 kg)   SpO2 100%   BMI 20.70 kg/m  Wt Readings from Last 3 Encounters:  08/19/15 152 lb 9.6 oz (69.2 kg)  08/10/15 151 lb 3.2 oz (68.6 kg)  08/05/15 151 lb 3.2 oz (68.6 kg)

## 2015-08-22 ENCOUNTER — Ambulatory Visit: Payer: BLUE CROSS/BLUE SHIELD

## 2015-08-22 ENCOUNTER — Ambulatory Visit
Admission: RE | Admit: 2015-08-22 | Discharge: 2015-08-22 | Disposition: A | Discharge status: Home / Self Care | Payer: BLUE CROSS/BLUE SHIELD | Source: Ambulatory Visit | Attending: Radiation Oncology | Admitting: Radiation Oncology

## 2015-08-22 DIAGNOSIS — C61 Malignant neoplasm of prostate: Secondary | ICD-10-CM | POA: Diagnosis not present

## 2015-08-23 ENCOUNTER — Ambulatory Visit: Payer: BLUE CROSS/BLUE SHIELD

## 2015-08-23 ENCOUNTER — Ambulatory Visit
Admission: RE | Admit: 2015-08-23 | Discharge: 2015-08-23 | Disposition: A | Discharge status: Home / Self Care | Payer: BLUE CROSS/BLUE SHIELD | Source: Ambulatory Visit | Attending: Radiation Oncology | Admitting: Radiation Oncology

## 2015-08-23 DIAGNOSIS — C61 Malignant neoplasm of prostate: Secondary | ICD-10-CM | POA: Diagnosis not present

## 2015-08-24 ENCOUNTER — Ambulatory Visit: Payer: BLUE CROSS/BLUE SHIELD

## 2015-08-24 ENCOUNTER — Ambulatory Visit
Admission: RE | Admit: 2015-08-24 | Discharge: 2015-08-24 | Disposition: A | Discharge status: Home / Self Care | Payer: BLUE CROSS/BLUE SHIELD | Source: Ambulatory Visit | Attending: Radiation Oncology | Admitting: Radiation Oncology

## 2015-08-24 DIAGNOSIS — C61 Malignant neoplasm of prostate: Secondary | ICD-10-CM | POA: Diagnosis not present

## 2015-08-25 ENCOUNTER — Ambulatory Visit
Admission: RE | Admit: 2015-08-25 | Discharge: 2015-08-25 | Disposition: A | Discharge status: Home / Self Care | Payer: BLUE CROSS/BLUE SHIELD | Source: Ambulatory Visit | Attending: Radiation Oncology | Admitting: Radiation Oncology

## 2015-08-25 ENCOUNTER — Ambulatory Visit
Admission: RE | Admit: 2015-08-25 | Payer: BLUE CROSS/BLUE SHIELD | Source: Ambulatory Visit | Admitting: Radiation Oncology

## 2015-08-25 ENCOUNTER — Ambulatory Visit: Payer: BLUE CROSS/BLUE SHIELD

## 2015-08-25 DIAGNOSIS — C61 Malignant neoplasm of prostate: Secondary | ICD-10-CM | POA: Diagnosis not present

## 2015-08-26 ENCOUNTER — Ambulatory Visit
Admission: RE | Admit: 2015-08-26 | Discharge: 2015-08-26 | Disposition: A | Discharge status: Home / Self Care | Payer: BLUE CROSS/BLUE SHIELD | Source: Ambulatory Visit | Attending: Radiation Oncology | Admitting: Radiation Oncology

## 2015-08-26 ENCOUNTER — Ambulatory Visit: Payer: BLUE CROSS/BLUE SHIELD

## 2015-08-26 ENCOUNTER — Encounter: Payer: Self-pay | Admitting: Radiation Oncology

## 2015-08-26 VITALS — BP 115/77 | HR 76 | Temp 98.5°F | Ht 72.0 in | Wt 151.6 lb

## 2015-08-26 DIAGNOSIS — C61 Malignant neoplasm of prostate: Secondary | ICD-10-CM

## 2015-08-26 NOTE — Progress Notes (Signed)
  Radiation Oncology         [phone]   Name: Isaac Ramsey MRN: [id number]   Date: 08/26/2015  DOB: 18-Sep-1955   Weekly Radiation Therapy Management    ICD-9-CM ICD-10-CM   1. Prostate cancer ([place]) 185 C61     Current Dose: 61.2 Gy  Planned Dose:  68.4 Gy  Narrative The patient presents for routine under treatment assessment. Kenderick has completed 33 fractions to his prostate.  He denies having pain, dysuria, hematuria.  He reports having urinary frequency and urgency.  He reports he does have sensation in his bladder now and is not "leaking" as much.  He is using 2 pads per day.  He reports his bladder does not seem to hold as much.  He reports having nocturia 5-6 times.  He reports having mild diarrhea and reports seeing what he thinks is dead skin in his bowel movements.  He reports his energy level is better..  The patient is without complaint. Set-up films were reviewed. The chart was checked.  Physical Findings  height is 6' (1.829 m) and weight is 151 lb 9.6 oz (68.8 kg). His oral temperature is 98.5 F (36.9 C). His blood pressure is 115/77 and his pulse is 76. His oxygen saturation is 99%. . Weight essentially stable.  No significant changes.  Impression The patient is tolerating radiation.  Plan Continue treatment as planned.         Sheral Apley Tammi Klippel, M.D.  This document serves as a record of services personally performed by [name], MD. It was created on his behalf by Bethann Humble, a trained medical scribe. The creation of this record is based on the scribe's personal observations and the provider's statements to them. This document has been checked and approved by the attending provider.

## 2015-08-26 NOTE — Progress Notes (Signed)
Isaac Ramsey has completed 33 fractions to his prostate.  He denies having pain, dysuria, hematuria.  He reports having urinary frequency and urgency.  He reports he does have sensation in his bladder now and is not "leaking" as much.  He is using 2 pads per day.  He reports his bladder does not seem to hold as much.  He reports having nocturia 5-6 times.  He reports having mild diarrhea and reports seeing what he thinks is dead skin in his bowel movements.  He reports his energy level is better.  BP 115/77 (BP Location: Left Arm, Patient Position: Sitting)   Pulse 76   Temp 98.5 F (36.9 C) (Oral)   Ht 6' (1.829 m)   Wt 151 lb 9.6 oz (68.8 kg)   SpO2 99%   BMI 20.56 kg/m    Wt Readings from Last 3 Encounters:  08/26/15 151 lb 9.6 oz (68.8 kg)  08/19/15 152 lb 9.6 oz (69.2 kg)  08/10/15 151 lb 3.2 oz (68.6 kg)

## 2015-08-29 ENCOUNTER — Ambulatory Visit: Payer: BLUE CROSS/BLUE SHIELD

## 2015-08-29 ENCOUNTER — Ambulatory Visit
Admission: RE | Admit: 2015-08-29 | Discharge: 2015-08-29 | Disposition: A | Discharge status: Home / Self Care | Payer: BLUE CROSS/BLUE SHIELD | Source: Ambulatory Visit | Attending: Radiation Oncology | Admitting: Radiation Oncology

## 2015-08-29 DIAGNOSIS — C61 Malignant neoplasm of prostate: Secondary | ICD-10-CM | POA: Diagnosis not present

## 2015-08-30 ENCOUNTER — Ambulatory Visit: Payer: BLUE CROSS/BLUE SHIELD

## 2015-08-30 ENCOUNTER — Ambulatory Visit
Admission: RE | Admit: 2015-08-30 | Discharge: 2015-08-30 | Disposition: A | Discharge status: Home / Self Care | Payer: BLUE CROSS/BLUE SHIELD | Source: Ambulatory Visit | Attending: Radiation Oncology | Admitting: Radiation Oncology

## 2015-08-30 DIAGNOSIS — C61 Malignant neoplasm of prostate: Secondary | ICD-10-CM | POA: Diagnosis not present

## 2015-08-31 ENCOUNTER — Ambulatory Visit
Admission: RE | Admit: 2015-08-31 | Discharge: 2015-08-31 | Disposition: A | Discharge status: Home / Self Care | Payer: BLUE CROSS/BLUE SHIELD | Source: Ambulatory Visit | Attending: Radiation Oncology | Admitting: Radiation Oncology

## 2015-08-31 ENCOUNTER — Ambulatory Visit: Payer: BLUE CROSS/BLUE SHIELD

## 2015-08-31 DIAGNOSIS — C61 Malignant neoplasm of prostate: Secondary | ICD-10-CM | POA: Diagnosis not present

## 2015-09-01 ENCOUNTER — Encounter: Payer: Self-pay | Admitting: Radiation Oncology

## 2015-09-01 ENCOUNTER — Ambulatory Visit: Payer: BLUE CROSS/BLUE SHIELD

## 2015-09-01 ENCOUNTER — Ambulatory Visit
Admission: RE | Admit: 2015-09-01 | Discharge: 2015-09-01 | Disposition: A | Discharge status: Home / Self Care | Payer: BLUE CROSS/BLUE SHIELD | Source: Ambulatory Visit | Attending: Radiation Oncology | Admitting: Radiation Oncology

## 2015-09-01 VITALS — BP 121/92 | HR 81 | Resp 16 | Wt 152.4 lb

## 2015-09-01 DIAGNOSIS — C61 Malignant neoplasm of prostate: Secondary | ICD-10-CM

## 2015-09-01 NOTE — Progress Notes (Addendum)
Weight and vitals stable. Denies pain. Denies dysuria or hematuria. Reports urinary frequency and urgency.Continues manage urinary incontinence with two pads per day. Reports his bladder feels smaller and unable to hold as much. Reports nocturia x 5-6. Reports diarrhea manageable. Reports some fatigue. Patient complete radiation today thus, one month follow up appointment card given. Patient understands to contact this RN with future needs.   BP (!) 121/92   Pulse 81   Resp 16   Wt 152 lb 6.4 oz (69.1 kg)   SpO2 100%   BMI 20.67 kg/m   Wt Readings from Last 3 Encounters:  09/01/15 152 lb 6.4 oz (69.1 kg)  08/26/15 151 lb 9.6 oz (68.8 kg)  08/19/15 152 lb 9.6 oz (69.2 kg)

## 2015-09-01 NOTE — Progress Notes (Signed)
  Radiation Oncology         [phone]   Name: Isaac Ramsey MRN: [id number]   Date: 09/01/2015  DOB: 04-15-1955    Weekly Radiation Therapy Management    ICD-9-CM ICD-10-CM   1. Prostate cancer ([place]) 185 C61     Current Dose: 68.4 Gy  Planned Dose:  68.4 Gy  Narrative The patient presents for routine under treatment assessment. Weight and vitals stable. Denies pain. Denies dysuria or hematuria. Reports nocturia x 5-6. Reports urinary frequency and urgency. Continues manage urinary incontinence with two pads per day. Reports his bladder feels smaller and unable to hold as much. Reports diarrhea manageable. Reports some fatigue. Set-up films were reviewed. The chart was checked.  Physical Findings  weight is 152 lb 6.4 oz (69.1 kg). His blood pressure is 121/92 (abnormal) and his pulse is 81. His respiration is 16 and oxygen saturation is 100%. . Weight essentially stable.  No significant changes.  Impression The patient is tolerating radiation.  Plan Continue treatment as planned. One month follow up appointment card was given to the patient.         Sheral Apley Tammi Klippel, M.D.  This document serves as a record of services personally performed by [name], MD. It was created on his behalf by Bethann Humble, a trained medical scribe. The creation of this record is based on the scribe's personal observations and the provider's statements to them. This document has been checked and approved by the attending provider.

## 2015-09-05 NOTE — Progress Notes (Signed)
  Radiation Oncology         [phone]) [phone] ________________________________  Name: Isaac Ramsey MRN: [id number]  Date: 09/01/2015  DOB: 21-Mar-1955  End of Treatment Note  Diagnosis:   60 y.o. 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     Indication for treatment:  Curative, Salvage Prostatic Fossa Radiotherapy       Radiation treatment dates:   07/12/15-09/01/15  Site/dose:   The prostatic fossa was treated to 68.4 Gy in 38 fractions of 1.8 Gy  Beams/energy:   The prostatic fossa was treated using helical intensity modulated radiotherapy delivering 6 megavolt photons. Image guidance was performed with megavoltage CT studies prior to each fraction. He was immobilized with a body fix lower extremity mold.  Narrative: The patient tolerated radiation treatment relatively well.    Denies pain. Denies dysuria or hematuria. Reports nocturia x 5-6. Reports urinary frequency and urgency. Continues manage urinary incontinence with two pads per day. Reports his bladder feels smaller and unable to hold as much. Reports diarrhea manageable. Reports some fatigue  Plan: The patient has completed radiation treatment. He will return to radiation oncology clinic for routine followup in one month. I advised him to call or return sooner if he has any questions or concerns related to his recovery or treatment. ________________________________  Sheral Apley. Tammi Klippel, M.D.

## 2015-10-11 ENCOUNTER — Ambulatory Visit
Admission: RE | Admit: 2015-10-11 | Discharge: 2015-10-11 | Disposition: A | Discharge status: Home / Self Care | Payer: BLUE CROSS/BLUE SHIELD | Source: Ambulatory Visit | Attending: Radiation Oncology | Admitting: Radiation Oncology

## 2015-10-11 ENCOUNTER — Encounter: Payer: Self-pay | Admitting: Radiation Oncology

## 2015-10-11 DIAGNOSIS — R351 Nocturia: Secondary | ICD-10-CM | POA: Diagnosis not present

## 2015-10-11 DIAGNOSIS — Z79899 Other long term (current) drug therapy: Secondary | ICD-10-CM | POA: Diagnosis not present

## 2015-10-11 DIAGNOSIS — C61 Malignant neoplasm of prostate: Secondary | ICD-10-CM

## 2015-10-11 MED ORDER — TAMSULOSIN HCL 0.4 MG PO CAPS: 0.4000 mg | ORAL_CAPSULE | Freq: Every day | ORAL | 5 refills | Status: DC

## 2015-10-11 NOTE — Progress Notes (Signed)
  Radiation Oncology         [phone]) [phone] ________________________________  Name: Isaac Ramsey MRN: [id number]  Date: 10/11/2015  DOB: 30-Oct-1955  Post Treatment Note  CC: Eulas Post, MD  Alexis Frock, MD  Diagnosis:   60 y.o. 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    Interval Since Last Radiation:  6 weeks   07/12/15-09/01/15:   The prostatic fossa was treated to 68.4 Gy in 38 fractions of 1.8 Gy  Narrative:  The patient returns today for routine follow-up. He did well with radiotherapy and had minimal fatigue as well as nocturia, urgency, and urinary frequency.                         On review of systems, the patient states he is back to baseline with incontinence, however continues to have urgency, incomplete emptying, and urinates every 2 hours while awake, as well as 5-6x in the evening. He has some fatigue, but this is improving. He denies any bowel dysfunction, fevers, chills, malodorous urine, flank pain, dysuria, or hematuria. No other complaints are verbalized.  ALLERGIES:  has No Known Allergies.  Meds: Current Outpatient Prescriptions  Medication Sig Dispense Refill  . tamsulosin (FLOMAX) 0.4 MG CAPS capsule Take 1 capsule (0.4 mg total) by mouth daily after supper. 30 capsule 5   No current facility-administered medications for this encounter.     Physical Findings:  height is 6' (1.829 m) and weight is 152 lb 9.6 oz (69.2 kg). His oral temperature is 98.2 F (36.8 C). His blood pressure is 126/81 and his pulse is 76.  In general this is a well appearing caucasian male in no acute distress. He's alert and oriented x4 and appropriate throughout the examination. Cardiopulmonary assessment is negative for acute distress and he exhibits normal effort.   Lab Findings: Lab Results  Component Value Date   WBC 4.9 08/13/2014   HGB 11.3 (L) 08/19/2014   HCT  33.9 (L) 08/19/2014   MCV 89.9 08/13/2014   PLT 181 08/13/2014     Radiographic Findings: No results found.  Impression/Plan: 1. 60 y.o. 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 The patient appears to have noted improvement in some symptoms since radiotherapy however continues to have difficulty with frequency, urgency, and incomplete emptying as well as multiple episodes of nocturia. He does not admit to any symptoms that seem to be more acute to indicate UTI. Rather, he is offered flomax, and counseled that this medication is typically more useful in patients who still have a prostate, but is willing to try to see if this will aid in better emptying, and ultimately to see if this will help nocturia. He is going to meet with Dr. Tresa Moore in November, and will try the flomax until then. I did counsel the patient on the side effect profile, and he will keep [place] informed if he has any concerns regarding this. We will see him back on an as needed basis moving forward.  2. Survivorship. The patient is interested in moving forward with a referral to the survivorship clinic. I will coordinate a referral to Mike Craze, NP.     Carola Rhine, PAC

## 2015-10-11 NOTE — Progress Notes (Signed)
Mr. Isaac Ramsey completed XRT for prostate cancer on 09/01/15.  He has appt. for Urology in November.  He reports frequent need to void often and at times he feel she does not empty completely.  Nocturia x 5. Reports  Urgency. Denies any rectal irritation nor diarrhea.  He reports mild muscle aches.  Had last Lupron injection April 15 th and has hot-flashes at this time.  BP 126/81   Pulse 76   Temp 98.2 F (36.8 C) (Oral)   Ht 6' (1.829 m)   Wt 152 lb 9.6 oz (69.2 kg)   BMI 20.70 kg/m    Wt Readings from Last 3 Encounters:  10/11/15 152 lb 9.6 oz (69.2 kg)  09/01/15 152 lb 6.4 oz (69.1 kg)  08/26/15 151 lb 9.6 oz (68.8 kg)

## 2015-10-12 ENCOUNTER — Other Ambulatory Visit: Payer: Self-pay | Admitting: Radiation Oncology

## 2015-10-12 ENCOUNTER — Telehealth: Payer: Self-pay | Admitting: *Deleted

## 2015-10-12 DIAGNOSIS — C61 Malignant neoplasm of prostate: Secondary | ICD-10-CM

## 2015-10-12 NOTE — Telephone Encounter (Signed)
CALLED PATIENT TO INFORM OF SURVIVORSHIP APPT. ON 11-18-15 @ 3 PM WITH GRETCHEN DAWSON, SPOKE WITH PATIENT AND HE IS AWARE OF THIS APPT.

## 2015-10-28 ENCOUNTER — Other Ambulatory Visit (INDEPENDENT_AMBULATORY_CARE_PROVIDER_SITE_OTHER): Payer: BLUE CROSS/BLUE SHIELD

## 2015-10-28 DIAGNOSIS — Z0000 Encounter for general adult medical examination without abnormal findings: Secondary | ICD-10-CM

## 2015-10-28 LAB — CBC WITH DIFFERENTIAL/PLATELET
BASOS PCT: 0.6 % (ref 0.0–3.0)
Basophils Absolute: 0 10*3/uL (ref 0.0–0.1)
Eosinophils Absolute: 0.3 10*3/uL (ref 0.0–0.7)
Eosinophils Relative: 8.1 % — ABNORMAL HIGH (ref 0.0–5.0)
HCT: 40.5 % (ref 39.0–52.0)
HEMOGLOBIN: 13.9 g/dL (ref 13.0–17.0)
Lymphocytes Relative: 27.1 % (ref 12.0–46.0)
Lymphs Abs: 1 10*3/uL (ref 0.7–4.0)
MCHC: 34.3 g/dL (ref 30.0–36.0)
MCV: 89.3 fl (ref 78.0–100.0)
MONOS PCT: 8 % (ref 3.0–12.0)
Monocytes Absolute: 0.3 10*3/uL (ref 0.1–1.0)
Neutro Abs: 2.1 10*3/uL (ref 1.4–7.7)
Neutrophils Relative %: 56.2 % (ref 43.0–77.0)
Platelets: 210 10*3/uL (ref 150.0–400.0)
RBC: 4.54 Mil/uL (ref 4.22–5.81)
RDW: 12.9 % (ref 11.5–15.5)
WBC: 3.8 10*3/uL — AB (ref 4.0–10.5)

## 2015-10-28 LAB — BASIC METABOLIC PANEL
BUN: 16 mg/dL (ref 6–23)
CHLORIDE: 102 meq/L (ref 96–112)
CO2: 32 mEq/L (ref 19–32)
Calcium: 9.6 mg/dL (ref 8.4–10.5)
Creatinine, Ser: 0.98 mg/dL (ref 0.40–1.50)
GFR: 82.67 mL/min (ref >60.00)
GLUCOSE: 90 mg/dL (ref 70–99)
POTASSIUM: 4.3 meq/L (ref 3.5–5.1)
SODIUM: 141 meq/L (ref 135–145)

## 2015-10-28 LAB — HEPATIC FUNCTION PANEL
ALBUMIN: 4 g/dL (ref 3.5–5.2)
ALT: 19 U/L (ref 0–53)
AST: 20 U/L (ref 0–37)
Alkaline Phosphatase: 62 U/L (ref 39–117)
Bilirubin, Direct: 0.2 mg/dL (ref 0.0–0.3)
TOTAL PROTEIN: 6.5 g/dL (ref 6.0–8.3)
Total Bilirubin: 1.6 mg/dL — ABNORMAL HIGH (ref 0.2–1.2)

## 2015-10-28 LAB — LIPID PANEL
CHOLESTEROL: 218 mg/dL — AB (ref 0–200)
HDL: 63.4 mg/dL (ref >39.00)
LDL Cholesterol: 128 mg/dL — ABNORMAL HIGH (ref 0–99)
NonHDL: 154.91
TRIGLYCERIDES: 136 mg/dL (ref 0.0–149.0)
Total CHOL/HDL Ratio: 3
VLDL: 27.2 mg/dL (ref 0.0–40.0)

## 2015-10-31 LAB — TSH: TSH: 2.22 u[IU]/mL (ref 0.35–4.50)

## 2015-11-04 ENCOUNTER — Ambulatory Visit (INDEPENDENT_AMBULATORY_CARE_PROVIDER_SITE_OTHER): Payer: BLUE CROSS/BLUE SHIELD | Admitting: Family Medicine

## 2015-11-04 ENCOUNTER — Encounter: Payer: Self-pay | Admitting: Family Medicine

## 2015-11-04 VITALS — BP 100/70 | HR 108 | Temp 98.0°F | Ht 72.0 in | Wt 152.1 lb

## 2015-11-04 DIAGNOSIS — Z0000 Encounter for general adult medical examination without abnormal findings: Secondary | ICD-10-CM | POA: Diagnosis not present

## 2015-11-04 NOTE — Patient Instructions (Signed)
Check on coverage for shingles vaccine if interested Consider Cologuard vs Colonoscopy.

## 2015-11-04 NOTE — Progress Notes (Signed)
Pre visit review using our clinic review tool, if applicable. No additional management support is needed unless otherwise documented below in the visit note. 

## 2015-11-04 NOTE — Progress Notes (Signed)
Subjective:     Patient ID: Isaac Ramsey, male   DOB: [birth year], 60 y.o.   MRN: [id number]  HPI Patient here for physical exam. He has history of prostate cancer and underwent robotic surgery last year and has had radiation and hormonal therapy this year. He's had some urine incontinence since his surgery and usually goes through about 3 pads per day. Appetite and weight are stable. Takes no regular medications. No history of shingles vaccine. No history of colonoscopy.  He has bilateral upper extremity tremor which is worse with intention. Also complains of some generalized arthralgias mostly involving the hands DIP and PIP joints with early morning stiffness.  Past Medical History:  Diagnosis Date  . Chickenpox   . Prostate cancer [facility])    Past Surgical History:  Procedure Laterality Date  . LYMPHADENECTOMY Bilateral 08/18/2014   Procedure: BILATERAL PELVIC LYMPHADENECTOMY WITH RIGHT INGUINAL HERNIA REPAIR;  Surgeon: Alexis Frock, MD;  Location: [facility];  Service: Urology;  Laterality: Bilateral;  . PROSTATE BIOPSY     3'16 -dx. prostate cancer  . ROBOT ASSISTED LAPAROSCOPIC RADICAL PROSTATECTOMY N/A 08/18/2014   Procedure: ROBOTIC ASSISTED LAPAROSCOPIC RADICAL PROSTATECTOMY WITH INDOCYANINE GREEN DYE;  Surgeon: Alexis Frock, MD;  Location: [facility];  Service: Urology;  Laterality: N/A;  . VASECTOMY      reports that he quit smoking about 31 years ago. He has never used smokeless tobacco. He reports that he does not drink alcohol or use drugs. family history includes Alcohol abuse in his brother; Arthritis in his mother; Cancer in his father; Heart disease (age of onset: 7) in his father; Hyperlipidemia in his father; Hypertension in his father and mother. No Known Allergies   Review of Systems  Constitutional: Negative for activity change, appetite change, fatigue, fever and unexpected weight change.  HENT: Negative for congestion, ear pain and trouble swallowing.   Eyes:  Negative for pain and visual disturbance.  Respiratory: Negative for cough, shortness of breath and wheezing.   Cardiovascular: Negative for chest pain and palpitations.  Gastrointestinal: Negative for abdominal distention, abdominal pain, blood in stool, constipation, diarrhea, nausea, rectal pain and vomiting.  Genitourinary: Negative for dysuria, hematuria and testicular pain.  Musculoskeletal: Positive for arthralgias. Negative for joint swelling.  Skin: Negative for rash.  Neurological: Negative for dizziness, syncope and headaches.  Hematological: Negative for adenopathy.  Psychiatric/Behavioral: Negative for confusion and dysphoric mood.       Objective:   Physical Exam  Constitutional: He is oriented to person, place, and time. He appears well-developed and well-nourished. No distress.  HENT:  Head: Normocephalic and atraumatic.  Right Ear: External ear normal.  Left Ear: External ear normal.  Mouth/Throat: Oropharynx is clear and moist.  Eyes: Conjunctivae and EOM are normal. Pupils are equal, round, and reactive to light.  Neck: Normal range of motion. Neck supple. No thyromegaly present.  Cardiovascular: Normal rate, regular rhythm and normal heart sounds.   No murmur heard. Pulmonary/Chest: No respiratory distress. He has no wheezes. He has no rales.  Abdominal: Soft. Bowel sounds are normal. He exhibits no distension and no mass. There is no tenderness. There is no rebound and no guarding.  Musculoskeletal: He exhibits no edema.  Lymphadenopathy:    He has no cervical adenopathy.  Neurological: He is alert and oriented to person, place, and time. He displays normal reflexes. No cranial nerve deficit.  Patient has fine tremor upper extremities which does not extinguish with movement. No cogwheel rigidity. Normal gait  Skin: No rash  noted.  Psychiatric: He has a normal mood and affect.       Assessment:     Physical exam. History of prostate cancer as above. 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    Plan:     -Recommend consideration for shingles vaccine and he wishes to wait at this time -Discussed the importance of colon cancer screening and he is not interested in scheduling colonoscopy yet. We did offer information about DNA-based stool testing which he will consider -He is getting follow up PSAs through Urology.  Eulas Post MD [facility]

## 2015-11-18 ENCOUNTER — Ambulatory Visit (HOSPITAL_BASED_OUTPATIENT_CLINIC_OR_DEPARTMENT_OTHER): Payer: BLUE CROSS/BLUE SHIELD | Admitting: Adult Health

## 2015-11-18 ENCOUNTER — Encounter: Payer: Self-pay | Admitting: Adult Health

## 2015-11-18 VITALS — BP 122/78 | HR 86 | Temp 97.5°F | Resp 18 | Ht 72.0 in | Wt 155.4 lb

## 2015-11-18 DIAGNOSIS — N529 Male erectile dysfunction, unspecified: Secondary | ICD-10-CM

## 2015-11-18 DIAGNOSIS — R32 Unspecified urinary incontinence: Secondary | ICD-10-CM

## 2015-11-18 DIAGNOSIS — C61 Malignant neoplasm of prostate: Secondary | ICD-10-CM

## 2015-11-18 DIAGNOSIS — M25512 Pain in left shoulder: Secondary | ICD-10-CM

## 2015-11-18 NOTE — Progress Notes (Signed)
CLINIC:  Survivorship  REASON FOR VISIT:  Survivorship Care Plan visit & to address acute survivorship needs for recent history of prostate cancer.   BRIEF ONCOLOGY HISTORY:    Prostate cancer ([facility])   10/17/2012 Initial Biopsy    Prostate biopsy: 1 core (+) for Gleason 6 (3+3) prostate adenoca to left mid lat (10%). 1 core with HGPIN to right mid.       10/17/2012 Tumor Marker    PSA 4.57      04/29/2013 Procedure    Confirmatory prostate biopsy: 2 cores (+) for Gleason 6 (3+3) to left mid lat (30%) and right mid lat (30%).  Pt elected to proceed with active surveillance       04/29/2013 Oncotype testing    Genomic Prostate Score: 16 (very-low risk)      02/24/2014 Tumor Marker    PSA 12.03 (pre-op)      02/24/2014 Imaging    MRI prostate: Triangular 1.4 x 0.9 cm lesion in left posterolateral peripheral zone, probably malignant. No extraprostatic spread identified.       05/12/2014 Procedure    Prostate biopsy: Gleason 8 (4+4) in right mid (5%); and 2 cores positive for Gleason 6 (3+3).       08/18/2014 Surgery    Radical prostatectomy (Dr. Tresa Moore).  Prostate adenocarcinoma, Gleason 7 (4+3), bilat seminal vesicle involvement, focal ECE. Lymph nodes benign; bladder neck resection margin benign.       10/2014 Tumor Marker    PSA 0.04      12/2014 Tumor Marker    PSA 0.11      03/2015 Tumor Marker    PSA 0.28; Lupron 45 mg given       06/2015 Tumor Marker    PSA 0.07; Lupron      07/12/2015 - 09/01/2015 Radiation Therapy    [facility]age curative-intent radiation therapy [facility]lippel).  The prostatic fossa was treated to 68.4 Gy in 38 fractions of 1.8 Gy      10/28/2015 Tumor Marker    PSA <0.02        INTERVAL HISTORY:  Isaac Ramsey reports to the [facility] for our initial meeting since he completed treatment with radiation therapy about 2.5 months ago.    He tells me he has been feeling well.  He denies any fatigue. He continues to have urinary  incontinence; uses about 2-3 pads/day.  He also wears a pad at bedtime, but has noticed that his nighttime urinary incontinence has been improving.  He has erectile dysfunction and plans to talk about it with his urologist, Dr. Tresa Moore, at his next visit; he tells me this is not a huge priority for him at this time.    He does report having some intermittent (L) shoulder pain and decreased range of motion, particularly when he tries to reach backward.  He has noticed this pain within the past 1-2 months; he feels like it is slowly improving and is not constant.      ADDITIONAL REVIEW OF SYSTEMS:  Review of Systems  Constitutional: Negative for fatigue.       Has gained some weight  HENT:  Negative.   Eyes: Negative.   Respiratory: Negative.   Cardiovascular: Negative.   Gastrointestinal: Positive for constipation.       Some constipation, but improving   Genitourinary: Positive for bladder incontinence.   Musculoskeletal: Positive for arthralgias.       Left shoulder pain   Skin: Negative.   Neurological: Negative.   Hematological: Negative.  Psychiatric/Behavioral: Negative.      PAST MEDICAL & SURGICAL HISTORY:  Past Medical History:  Diagnosis Date  . Chickenpox   . Prostate cancer [facility])     Past Surgical History:  Procedure Laterality Date  . LYMPHADENECTOMY Bilateral 08/18/2014   Procedure: BILATERAL PELVIC LYMPHADENECTOMY WITH RIGHT INGUINAL HERNIA REPAIR;  Surgeon: Alexis Frock, MD;  Location: [facility];  Service: Urology;  Laterality: Bilateral;  . PROSTATE BIOPSY     3'16 -dx. prostate cancer  . ROBOT ASSISTED LAPAROSCOPIC RADICAL PROSTATECTOMY N/A 08/18/2014   Procedure: ROBOTIC ASSISTED LAPAROSCOPIC RADICAL PROSTATECTOMY WITH INDOCYANINE GREEN DYE;  Surgeon: Alexis Frock, MD;  Location: [facility];  Service: Urology;  Laterality: N/A;  . VASECTOMY       CURRENT MEDICATIONS:  No current outpatient prescriptions on file prior to visit.   No current  facility-administered medications on file prior to visit.     ALLERGIES:  No Known Allergies  SOCIAL HISTORY:   Isaac Ramsey is married and lives with his wife in [place], [place].  They have 3 adult children.  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 He denies any current tobacco, alcohol, or illicit drug use.    PHYSICAL EXAM:  Vitals:  Vitals:   11/18/15 1426  BP: 122/78  Pulse: 86  Resp: 18  Temp: 97.5 F (36.4 C)   Filed Weights   11/18/15 1426  Weight: 155 lb 6.4 oz (70.5 kg)   General: Well-nourished, well-appearing male in no acute distress.  Unaccompanied today.  HEENT: Head is normocephalic.  Pupils equal and reactive to light. Conjunctivae clear without exudate.  Sclerae anicteric. Oral mucosa pink and moist.  Lymph: No cervical, supraclavicular, or infraclavicular lymphadenopathy noted on palpation.   Respiratory: Clear to auscultation bilaterally. Chest expansion symmetric; breathing non-labored.   GU: Deferred.  Recent exam with urologist.  Neuro/MSK: No focal deficits. Steady gait.  No focal tenderness to left glenohumeral joint. Mild supraspinatus pain with extension.  Psych: Normal mood and affect for situation. Extremities: No edema. Skin: Warm and dry.   LABORATORY DATA:  None for this visit.   DIAGNOSTIC IMAGING:  None for this visit.    ASSESSMENT & PLAN:  Isaac Ramsey is a pleasant 60 y.o. male with prostate cancer who was initially diagnosed in 10/2012 with Gleason 6 (3+3) prostate adenocarcinoma. He elected to undergo active surveillance at that time.  His PSA continued to rise to 12.03 in 02/24/14.  He proceeded to surgery with non-nerve sparing radical prostatectomy with bilateral seminal vesicle involvement and focal extracapsular extension; pathologic Stage III ([id number]) Gleason 7 (4+3) adenocarcinoma of the prostate.  Post-operative PSA 0.04 in 10/2014.  He continued with PSA surveillance, when his PSA was noted  to be on the rise.  In 03/2015, PSA 0.28 and ADT with Lupron injection initiated.  He had additional Lupron injection in 06/2015 (to complete 6 months of ADT).  He went on to complete salvage curative-intent radiation therapy to the prostatic fossa from 07/12/15-09/01/15.  Post-radiation PSA nadir <0.02 on 10/28/15.   Patient presents to survivorship clinic today for survivorship care plan visit and to address any acute survivorship concerns since completing treatment.    1. Stage III, Gleason 7 (4+3) prostate cancer:  Isaac Ramsey is continuing to recover from radiation therapy.  He will see Dr. Tresa Moore in 05/2016 for subsequent follow-up and PSA check.   Today, he received a copy of his [facility]) document, which was reviewed with him  in detail.  The SCP details his cancer treatment history and potential late/long-term side effects of those treatments.  We discussed the follow-up schedule he can anticipate with interval imaging for surveillance of his cancer.  I have also shared a copy of his treatment summary/SCP with his PCP.   2. Urinary incontinence: The urinary incontinence is currently his most distressing symptom.  He has been dealing with urinary incontinence since his prostatectomy in 08/2014. He shared that after his surgery he was doing the pelvic floor exercises very often and his symptoms were improving.  After radiation, he has not been as faithful about doing the exercises, but feels that his incontinence is slowly returning to his baseline and getting better.  I encouraged him to resume his pelvic floor exercises and discuss different strategies that may be helpful with his urologist at his next follow-up visit.   3. Left shoulder pain: We discussed that his left shoulder pain could certainly be secondary to possible arthritis, muscle strain, or rotator cuff injury.  He understands that if the pain does not improve, then he needs to let his urologist know.  I offered to send him  for a left shoulder x-ray today, but he prefers to just monitor his symptoms for now, which is reasonable.  I have low suspicion that this pain is bone pain related to his prostate cancer, but encouraged him to monitor his symptoms and if it does not resolve, it will require additional evaluation.  He agrees with this plan.  We will plan to give him a call in a couple of weeks to ensure his pain is continuing to improve.    4. Erectile dysfunction/Intimacy:  This is not a current high priority for the patient, but is a very common concern among prostate cancer survivors.  I gave him a brochure for Dr. Lavella Lemons, PhD who is a licensed sex therapist here in [place], with special training in sexuality/intimacy concerns for men after prostate cancer treatments.  I encouraged Isaac Ramsey to contact Dr. Valere Dross if he wishes to see him in the future.   5. Physical activity/Healthy eating: Getting adequate physical activity and maintaining a healthy diet as a cancer survivor is important for overall wellness and reduces the risk of cancer recurrence. We discussed the [facility], which is a fitness program that is offered to cancer survivors free of charge.  We also reviewed "The Nutrition Rainbow" handout, as well as the American Cancer Society's booklet with recommendations for nutrition and physical activity. He was given a Engineer, civil (consulting) with instructions on how to get enrolled in this program if he chooses to do so.   6. Health promotion/Cancer screening:  Isaac Ramsey was given the national recommendations for colonoscopy, skin screenings, and vaccinations.  I encouraged him to talk with his PCP about arranging appropriate cancer screening tests, as clinically indicated.  7. Support services/Counseling: It is not uncommon for this period of the patient's cancer care trajectory to be one of many emotions and stressors.  Isaac Ramsey was encouraged to take advantage of our many support  services programs, support groups, and/or counseling in coping with her new life as a cancer survivor after completing anti-cancer treatment. I specifically highlighted for him when the Prostate Cancer Support Group is held here at the cancer center.  The fellowship of other survivors/caregivers in group therapy facilitated by our prostate cancer support group leader could be helpful for both the patient and his caregiver.   He was given  a calendar of the cancer center's support services events, as well as brochures for our spiritual care and free counseling resources.     Dispo:  -Return to survivorship clinic as needed; no additional follow-up needed at this time.   -Consider transitioning the patient to long-term survivorship, when clinically appropriate.    A total of 40 minutes was spent in face-to-face care of this patient, with greater than 50% of that time spent in counseling and care coordination.    Mike Craze, NP [place] [phone]

## 2015-12-07 ENCOUNTER — Telehealth: Payer: Self-pay | Admitting: *Deleted

## 2015-12-07 NOTE — Telephone Encounter (Signed)
Called pt to f/u about the shoulder pain he mentioned at the last visit here. No answer but told him to call this nurse back to let me know either way if pain has resolved or gotten worse. Message to be fwd to G. Dawson,NP.

## 2015-12-08 ENCOUNTER — Other Ambulatory Visit: Payer: Self-pay | Admitting: Emergency Medicine

## 2015-12-08 ENCOUNTER — Other Ambulatory Visit: Payer: Self-pay | Admitting: Adult Health

## 2015-12-08 ENCOUNTER — Telehealth: Payer: Self-pay | Admitting: Emergency Medicine

## 2015-12-08 DIAGNOSIS — C61 Malignant neoplasm of prostate: Secondary | ICD-10-CM

## 2015-12-08 DIAGNOSIS — M25512 Pain in left shoulder: Secondary | ICD-10-CM

## 2015-12-08 NOTE — Telephone Encounter (Signed)
Patient returning [facility] LPN call; patient called to report status of L shoulder pain; per patient no improvement noted and states pain is the same to worse than it was when seen in the office. Patient states he would like to go ahead and get the xray now.   X ray entered per Mike Craze NP and patient notified to report to [facility] radiology department at his convenience for study. Patient instructed once xray is complete and reviewed he will be contacted from this office for further directions. Patient verbalized understanding.

## 2015-12-09 ENCOUNTER — Other Ambulatory Visit: Payer: Self-pay | Admitting: Adult Health

## 2015-12-09 ENCOUNTER — Ambulatory Visit (HOSPITAL_COMMUNITY)
Admission: RE | Admit: 2015-12-09 | Discharge: 2015-12-09 | Disposition: A | Discharge status: Home / Self Care | Payer: BLUE CROSS/BLUE SHIELD | Source: Ambulatory Visit | Attending: Adult Health | Admitting: Adult Health

## 2015-12-09 DIAGNOSIS — M25512 Pain in left shoulder: Secondary | ICD-10-CM | POA: Insufficient documentation

## 2015-12-09 DIAGNOSIS — R918 Other nonspecific abnormal finding of lung field: Secondary | ICD-10-CM | POA: Diagnosis not present

## 2015-12-09 DIAGNOSIS — R937 Abnormal findings on diagnostic imaging of other parts of musculoskeletal system: Secondary | ICD-10-CM | POA: Insufficient documentation

## 2015-12-09 DIAGNOSIS — C61 Malignant neoplasm of prostate: Secondary | ICD-10-CM

## 2015-12-09 DIAGNOSIS — M19012 Primary osteoarthritis, left shoulder: Secondary | ICD-10-CM | POA: Diagnosis not present

## 2015-12-09 NOTE — Progress Notes (Signed)
I called and spoke with [name] regarding the results of his left shoulder x-ray. There is mention of a sclerotic lesion over the left glenoid and the radiologist recommends bone scan to rule out metastatic disease. There is also mention of degenerative changes and subtle AC separation possible.    I reviewed the results with [name] and shared with him that I have low suspicion that this lesion will show cancer, but given the radiologist's recommendation we should proceed with bone scan.  He agrees with this plan. Orders placed with scheduling requests today. I encouraged him to call me with any questions or concerns. Of course I will call him with the results of the scan when they are made available to me.  I sent an Inbasket message to Dr. Tresa Moore, the patient's urologist, to provide him with an update as well.   Isaac Ramsey, Isaac Ramsey [phone]

## 2015-12-23 ENCOUNTER — Encounter (HOSPITAL_COMMUNITY)
Admission: RE | Admit: 2015-12-23 | Discharge: 2015-12-23 | Disposition: A | Discharge status: Home / Self Care | Payer: BLUE CROSS/BLUE SHIELD | Source: Ambulatory Visit | Attending: Adult Health | Admitting: Adult Health

## 2015-12-23 DIAGNOSIS — M25512 Pain in left shoulder: Secondary | ICD-10-CM

## 2015-12-23 DIAGNOSIS — C61 Malignant neoplasm of prostate: Secondary | ICD-10-CM | POA: Insufficient documentation

## 2015-12-23 MED ORDER — TECHNETIUM TC 99M MEDRONATE IV KIT
25.0000 | PACK | Freq: Once | INTRAVENOUS | Status: AC | PRN
  Administered 2015-12-23: 21 via INTRAVENOUS

## 2015-12-30 ENCOUNTER — Telehealth: Payer: Self-pay | Admitting: *Deleted

## 2015-12-30 NOTE — Telephone Encounter (Signed)
Called to inform pt that bone scan did not show any metastatic disease. I asked pt how was the shoulder doing, was he still having pain. Pt said, "I am still having pain, it's gotten a little worse, particularly hurts when reaching behind to his back. I told pt that I will speak to Alisa Graff on Wednesday to see if she wants to make a referral to the Orthopedic doctor. Either way I will call pt back. Message to be fwd to G. Dawson,NP.

## 2016-01-03 ENCOUNTER — Other Ambulatory Visit: Payer: Self-pay | Admitting: Adult Health

## 2016-01-03 DIAGNOSIS — C61 Malignant neoplasm of prostate: Secondary | ICD-10-CM

## 2016-01-03 DIAGNOSIS — M25512 Pain in left shoulder: Secondary | ICD-10-CM

## 2016-01-03 DIAGNOSIS — M25612 Stiffness of left shoulder, not elsewhere classified: Secondary | ICD-10-CM

## 2016-01-04 ENCOUNTER — Telehealth: Payer: Self-pay | Admitting: *Deleted

## 2016-01-04 NOTE — Telephone Encounter (Signed)
Called pt with info concerning appt scheduled with [facility] / Dr. Onnie Graham on Jan.9@1 :45p to evaluate and treat left shoulder pain. Pt verbalized understanding. Told pt I will fax a copy of the report from X-ray and bone scan to doctor. No further concerns. Message to be fwd to [employer].

## 2016-01-10 DIAGNOSIS — M7501 Adhesive capsulitis of right shoulder: Secondary | ICD-10-CM | POA: Diagnosis not present

## 2016-01-10 DIAGNOSIS — M7502 Adhesive capsulitis of left shoulder: Secondary | ICD-10-CM | POA: Diagnosis not present

## 2016-01-10 DIAGNOSIS — M25512 Pain in left shoulder: Secondary | ICD-10-CM | POA: Diagnosis not present

## 2016-01-10 DIAGNOSIS — G8929 Other chronic pain: Secondary | ICD-10-CM | POA: Diagnosis not present

## 2016-01-20 ENCOUNTER — Ambulatory Visit (INDEPENDENT_AMBULATORY_CARE_PROVIDER_SITE_OTHER): Payer: BLUE CROSS/BLUE SHIELD | Admitting: Family Medicine

## 2016-01-20 ENCOUNTER — Encounter: Payer: Self-pay | Admitting: Family Medicine

## 2016-01-20 VITALS — BP 118/80 | HR 95 | Temp 97.4°F | Ht 72.0 in | Wt 158.0 lb

## 2016-01-20 DIAGNOSIS — B029 Zoster without complications: Secondary | ICD-10-CM

## 2016-01-20 NOTE — Patient Instructions (Signed)
Shingles Shingles, which is also known as herpes zoster, is an infection that causes a painful skin rash and fluid-filled blisters. Shingles is not related to genital herpes, which is a sexually transmitted infection. Shingles only develops in people who:  Have had chickenpox.  Have received the chickenpox vaccine. (This is rare.)  What are the causes? Shingles is caused by varicella-zoster virus (VZV). This is the same virus that causes chickenpox. After exposure to VZV, the virus stays in the body in an inactive (dormant) state. Shingles develops if the virus reactivates. This can happen many years after the initial exposure to VZV. It is not known what causes this virus to reactivate. What increases the risk? People who have had chickenpox or received the chickenpox vaccine are at risk for shingles. Infection is more common in people who:  Are older than age 50.  Have a weakened defense (immune) system, such as those with HIV, AIDS, or cancer.  Are taking medicines that weaken the immune system, such as transplant medicines.  Are under great stress.  What are the signs or symptoms? Early symptoms of this condition include itching, tingling, and pain in an area on your skin. Pain may be described as burning, stabbing, or throbbing. A few days or weeks after symptoms start, a painful red rash appears, usually on one side of the body in a bandlike or beltlike pattern. The rash eventually turns into fluid-filled blisters that break open, scab over, and dry up in about 2-3 weeks. At any time during the infection, you may also develop:  A fever.  Chills.  A headache.  An upset stomach.  How is this diagnosed? This condition is diagnosed with a skin exam. Sometimes, skin or fluid samples are taken from the blisters before a diagnosis is made. These samples are examined under a microscope or sent to a lab for testing. How is this treated? There is no specific cure for this condition.  Your health care provider will probably prescribe medicines to help you manage pain, recover more quickly, and avoid long-term problems. Medicines may include:  Antiviral drugs.  Anti-inflammatory drugs.  Pain medicines.  If the area involved is on your face, you may be referred to a specialist, such as an eye doctor (ophthalmologist) or an ear, nose, and throat (ENT) doctor to help you avoid eye problems, chronic pain, or disability. Follow these instructions at home: Medicines  Take medicines only as directed by your health care provider.  Apply an anti-itch or numbing cream to the affected area as directed by your health care provider. Blister and Rash Care  Take a cool bath or apply cool compresses to the area of the rash or blisters as directed by your health care provider. This may help with pain and itching.  Keep your rash covered with a loose bandage (dressing). Wear loose-fitting clothing to help ease the pain of material rubbing against the rash.  Keep your rash and blisters clean with mild soap and cool water or as directed by your health care provider.  Check your rash every day for signs of infection. These include redness, swelling, and pain that lasts or increases.  Do not pick your blisters.  Do not scratch your rash. General instructions  Rest as directed by your health care provider.  Keep all follow-up visits as directed by your health care provider. This is important.  Until your blisters scab over, your infection can cause chickenpox in people who have never had it or been vaccinated   against it. To prevent this from happening, avoid contact with other people, especially: ? Babies. ? Pregnant women. ? Children who have eczema. ? Elderly people who have transplants. ? People who have chronic illnesses, such as leukemia or AIDS. Contact a health care provider if:  Your pain is not relieved with prescribed medicines.  Your pain does not get better after  the rash heals.  Your rash looks infected. Signs of infection include redness, swelling, and pain that lasts or increases. Get help right away if:  The rash is on your face or nose.  You have facial pain, pain around your eye area, or loss of feeling on one side of your face.  You have ear pain or you have ringing in your ear.  You have loss of taste.  Your condition gets worse. This information is not intended to replace advice given to you by your health care provider. Make sure you discuss any questions you have with your health care provider. Document Released: 12/18/2004 Document Revised: 08/14/2015 Document Reviewed: 10/29/2013 Elsevier Interactive Patient Education  2017 Elsevier Inc.  

## 2016-01-20 NOTE — Progress Notes (Signed)
Subjective:     Patient ID: Isaac Ramsey, male   DOB: Jan 21, 1955, 61 y.o.   MRN: [id number]  HPI Patient seen with rash on his buttocks on the right side about a week ago. Initially had a cluster of rash and then a few days later noticed some rash just inferior and lateral to that. He has some mild itching. Very mild pain out 3 out of 10 severity. He tried some Neosporin without much improvement. He initially thought this may be some type of bug bite. No exacerbating or alleviating factors. No fevers or chills. He had left shoulder cortisone injection on January 9  Past Medical History:  Diagnosis Date  . Chickenpox   . Prostate cancer [facility])    Past Surgical History:  Procedure Laterality Date  . LYMPHADENECTOMY Bilateral 08/18/2014   Procedure: BILATERAL PELVIC LYMPHADENECTOMY WITH RIGHT INGUINAL HERNIA REPAIR;  Surgeon: Alexis Frock, MD;  Location: [facility];  Service: Urology;  Laterality: Bilateral;  . PROSTATE BIOPSY     3'16 -dx. prostate cancer  . ROBOT ASSISTED LAPAROSCOPIC RADICAL PROSTATECTOMY N/A 08/18/2014   Procedure: ROBOTIC ASSISTED LAPAROSCOPIC RADICAL PROSTATECTOMY WITH INDOCYANINE GREEN DYE;  Surgeon: Alexis Frock, MD;  Location: [facility];  Service: Urology;  Laterality: N/A;  . VASECTOMY      reports that he quit smoking about 32 years ago. He has never used smokeless tobacco. He reports that he does not drink alcohol or use drugs. family history includes Alcohol abuse in his brother; Arthritis in his mother; Cancer in his father; Heart disease (age of onset: 21) in his father; Hyperlipidemia in his father; Hypertension in his father and mother. No Known Allergies   Review of Systems  Constitutional: Negative for chills and fever.  Skin: Positive for rash.       Objective:   Physical Exam  Constitutional: He appears well-developed and well-nourished.  Cardiovascular: Normal rate and regular rhythm.   Skin: Rash noted.  Right buttock reveals cluster of crusted  lesions and just inferior lateral this he has a very small similar cluster       Assessment:     Shingles. Fortunately, he has minimal associated pain    Plan:     -Keep clean with soap and water -Leave off Neosporin -Elected not to start Valtrex since he has had this for full week now  Eulas Post MD [facility]

## 2016-01-20 NOTE — Progress Notes (Signed)
Pre visit review using our clinic review tool, if applicable. No additional management support is needed unless otherwise documented below in the visit note. 

## 2016-01-25 DIAGNOSIS — M7501 Adhesive capsulitis of right shoulder: Secondary | ICD-10-CM | POA: Diagnosis not present

## 2016-01-25 DIAGNOSIS — M7502 Adhesive capsulitis of left shoulder: Secondary | ICD-10-CM | POA: Diagnosis not present

## 2016-02-01 DIAGNOSIS — M7502 Adhesive capsulitis of left shoulder: Secondary | ICD-10-CM | POA: Diagnosis not present

## 2016-02-01 DIAGNOSIS — M7501 Adhesive capsulitis of right shoulder: Secondary | ICD-10-CM | POA: Diagnosis not present

## 2016-02-07 DIAGNOSIS — M7501 Adhesive capsulitis of right shoulder: Secondary | ICD-10-CM | POA: Diagnosis not present

## 2016-02-07 DIAGNOSIS — M7502 Adhesive capsulitis of left shoulder: Secondary | ICD-10-CM | POA: Diagnosis not present

## 2016-02-14 DIAGNOSIS — M7501 Adhesive capsulitis of right shoulder: Secondary | ICD-10-CM | POA: Diagnosis not present

## 2016-02-14 DIAGNOSIS — M7502 Adhesive capsulitis of left shoulder: Secondary | ICD-10-CM | POA: Diagnosis not present

## 2016-02-17 DIAGNOSIS — M7502 Adhesive capsulitis of left shoulder: Secondary | ICD-10-CM | POA: Diagnosis not present

## 2016-02-17 DIAGNOSIS — M7501 Adhesive capsulitis of right shoulder: Secondary | ICD-10-CM | POA: Diagnosis not present

## 2016-05-14 DIAGNOSIS — Z8546 Personal history of malignant neoplasm of prostate: Secondary | ICD-10-CM | POA: Diagnosis not present

## 2016-05-14 LAB — PSA

## 2016-05-24 DIAGNOSIS — N393 Stress incontinence (female) (male): Secondary | ICD-10-CM | POA: Diagnosis not present

## 2016-05-24 DIAGNOSIS — N5201 Erectile dysfunction due to arterial insufficiency: Secondary | ICD-10-CM | POA: Diagnosis not present

## 2016-05-24 DIAGNOSIS — C61 Malignant neoplasm of prostate: Secondary | ICD-10-CM | POA: Diagnosis not present

## 2016-06-04 ENCOUNTER — Encounter: Payer: Self-pay | Admitting: Family Medicine

## 2016-09-10 DIAGNOSIS — R350 Frequency of micturition: Secondary | ICD-10-CM | POA: Diagnosis not present

## 2016-09-10 DIAGNOSIS — N393 Stress incontinence (female) (male): Secondary | ICD-10-CM | POA: Diagnosis not present

## 2016-09-10 DIAGNOSIS — R351 Nocturia: Secondary | ICD-10-CM | POA: Diagnosis not present

## 2016-09-28 DIAGNOSIS — R351 Nocturia: Secondary | ICD-10-CM | POA: Diagnosis not present

## 2016-09-28 DIAGNOSIS — R350 Frequency of micturition: Secondary | ICD-10-CM | POA: Diagnosis not present

## 2016-09-28 DIAGNOSIS — N393 Stress incontinence (female) (male): Secondary | ICD-10-CM | POA: Diagnosis not present

## 2016-10-15 DIAGNOSIS — R350 Frequency of micturition: Secondary | ICD-10-CM | POA: Diagnosis not present

## 2016-10-15 DIAGNOSIS — N393 Stress incontinence (female) (male): Secondary | ICD-10-CM | POA: Diagnosis not present

## 2016-11-29 DIAGNOSIS — C61 Malignant neoplasm of prostate: Secondary | ICD-10-CM | POA: Diagnosis not present

## 2016-12-03 DIAGNOSIS — N3946 Mixed incontinence: Secondary | ICD-10-CM | POA: Diagnosis not present

## 2016-12-03 DIAGNOSIS — N5201 Erectile dysfunction due to arterial insufficiency: Secondary | ICD-10-CM | POA: Diagnosis not present

## 2016-12-03 DIAGNOSIS — C61 Malignant neoplasm of prostate: Secondary | ICD-10-CM | POA: Diagnosis not present

## 2016-12-16 IMAGING — DX DG SHOULDER 2+V*L*
4 series · 4 of 4 positions shown · non-contrast
Comparison: No prior.

CLINICAL DATA: Prostate cancer.  Pain left shoulder.

EXAM:
LEFT SHOULDER - 2+ VIEW

[shoulder grashey (1 of 2)]
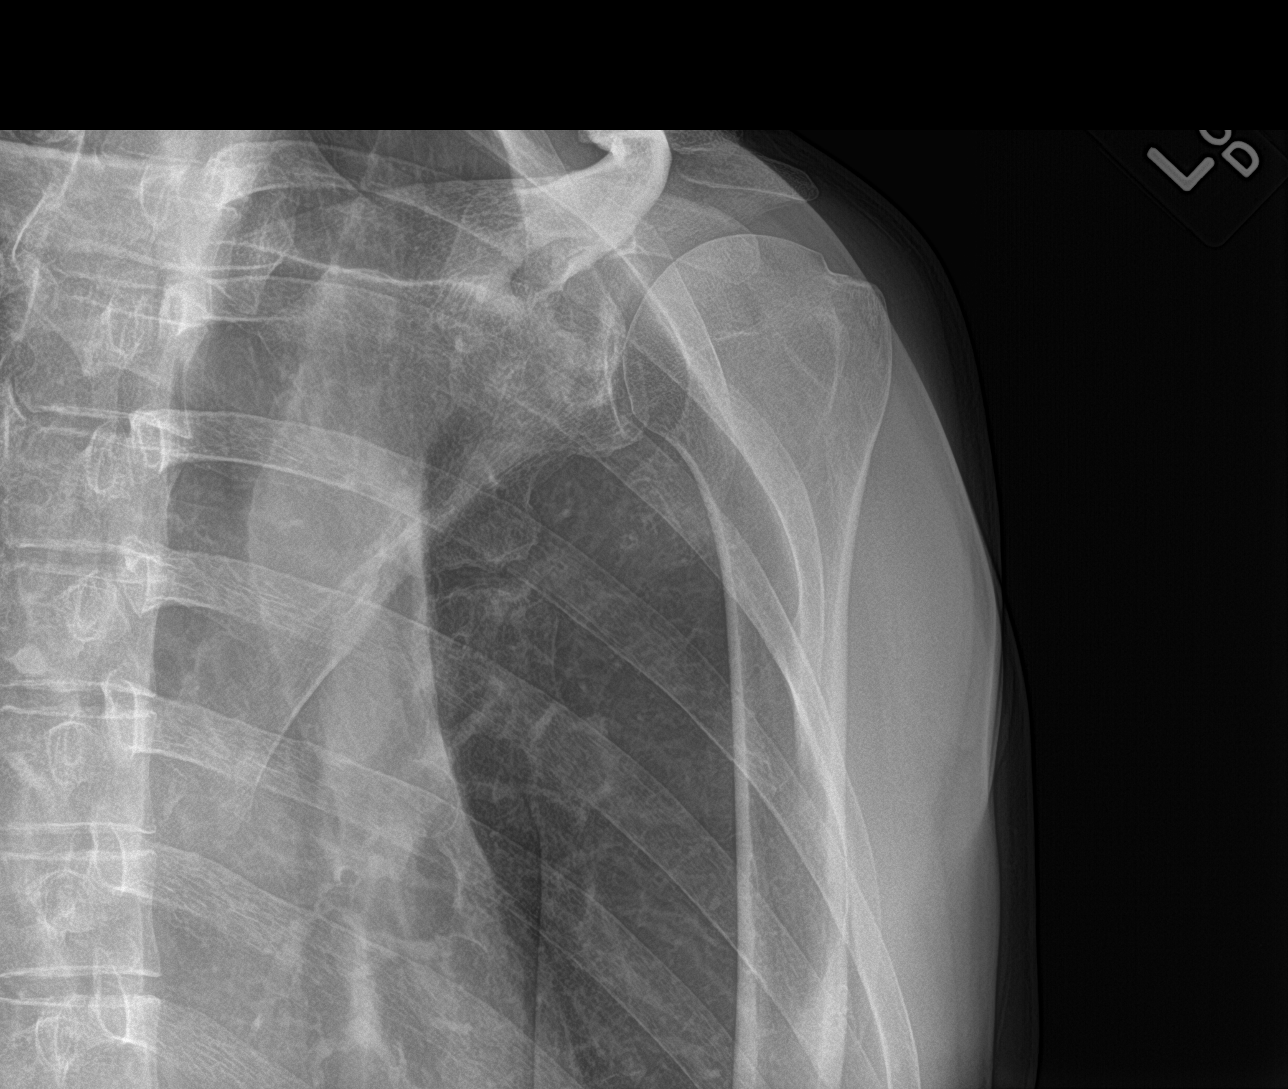

[shoulder y view]
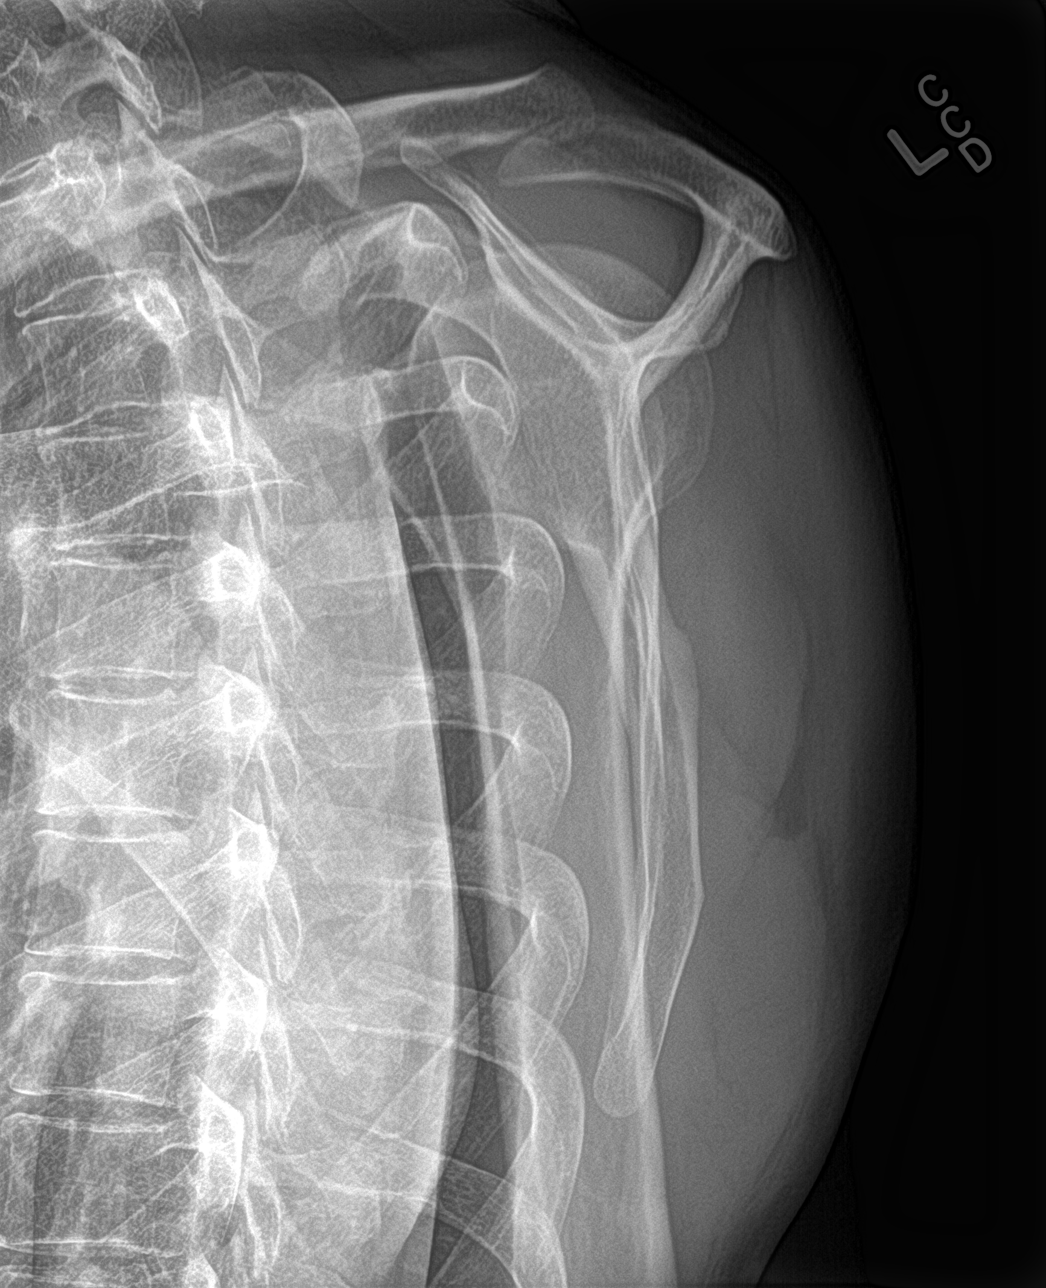

[shoulder axillary]
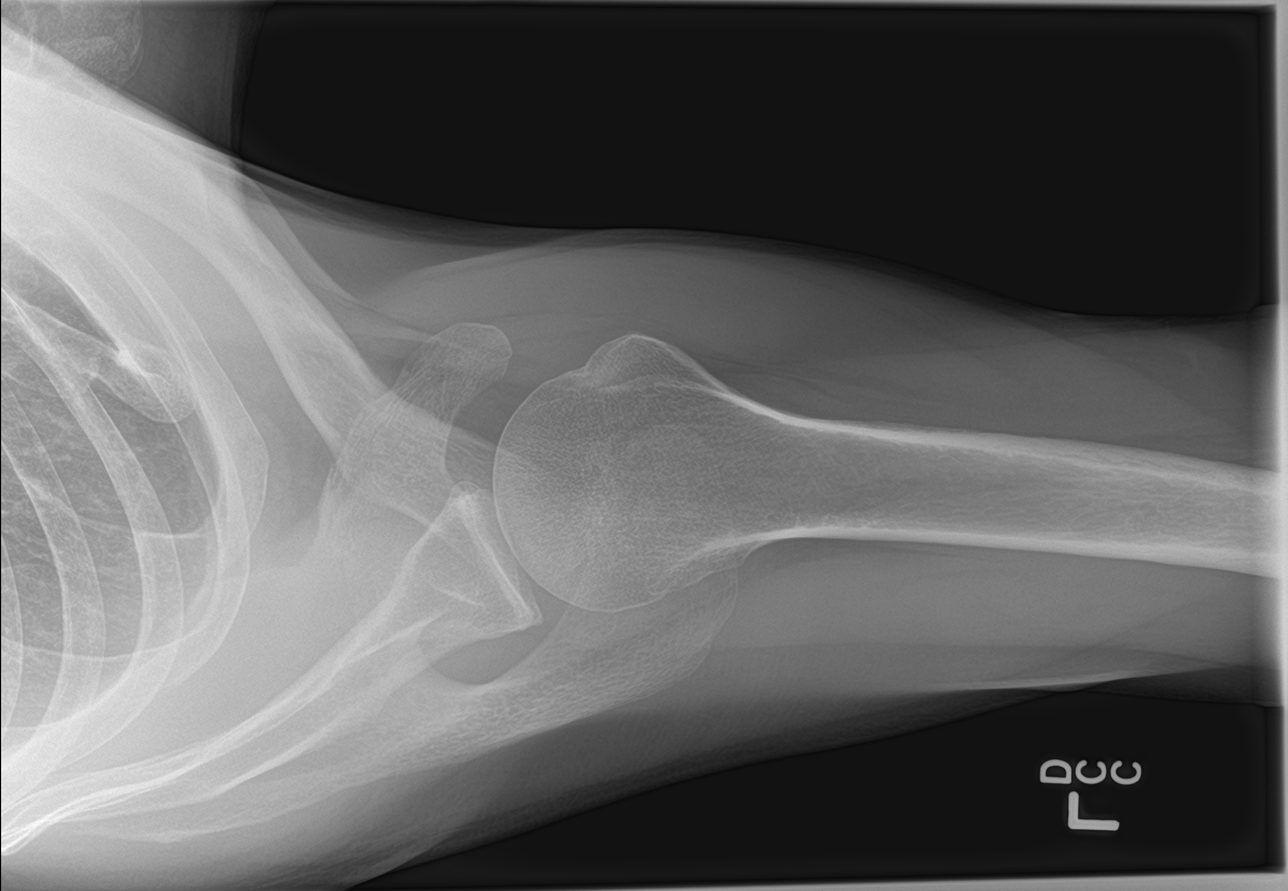

[shoulder grashey (2 of 2)]
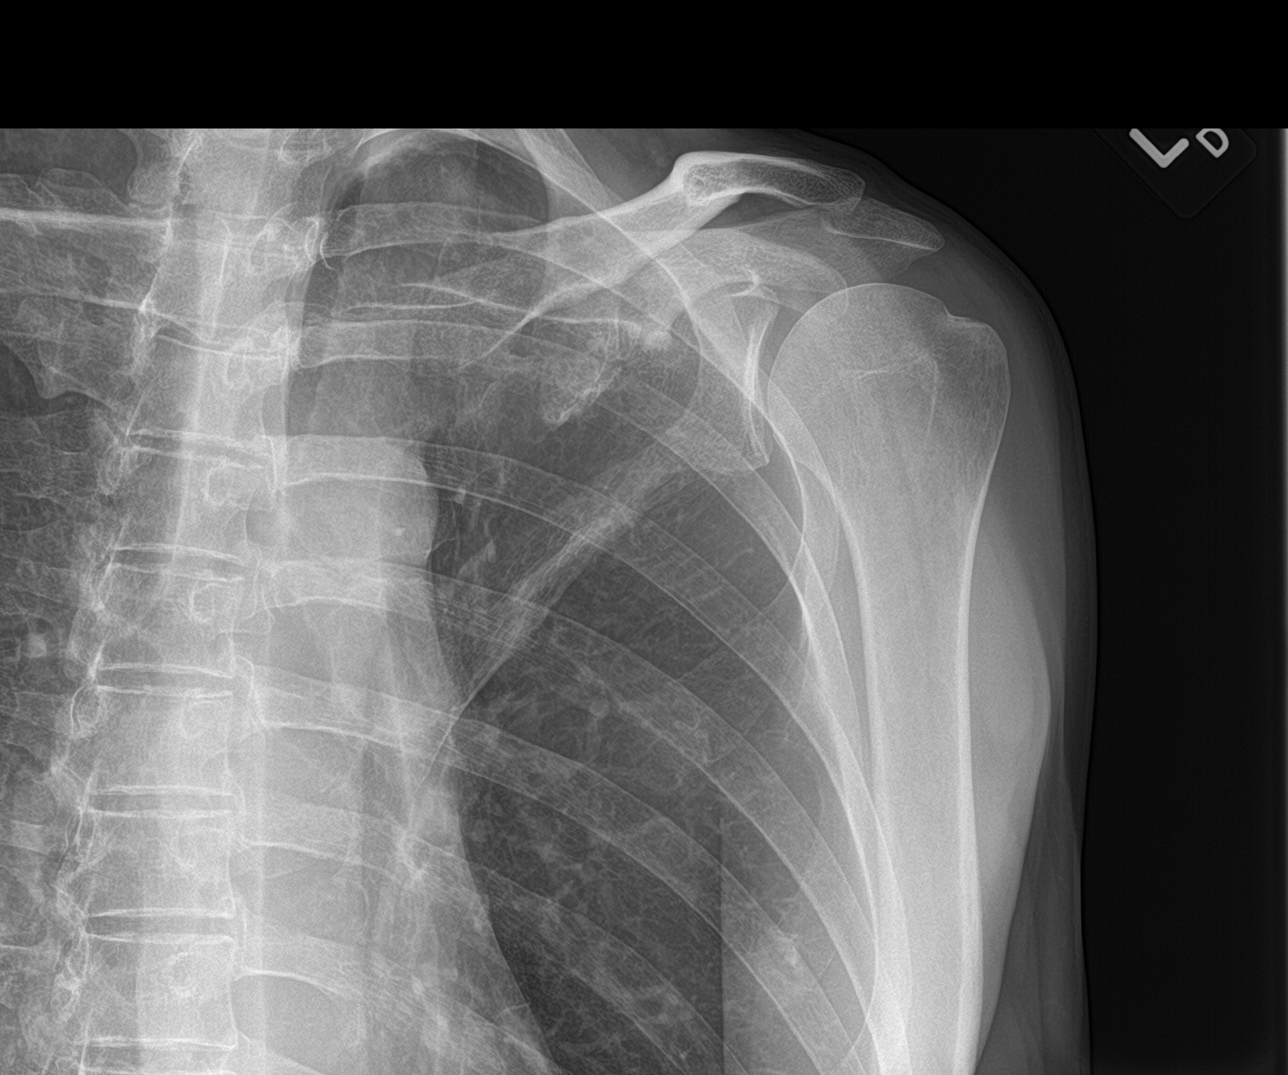

[4 of 4 positions shown; findings below may reference images not displayed]

FINDINGS: No evidence of fracture or dislocation. Subtle left AC separation
cannot be completely excluded. Clinical correlation suggested. AP
views with without weights both shoulders can be obtained if needed.
Glenohumeral degenerative change. Questionable small sclerotic
density noted over the glenoid. Whole-body bone scan with attention
to the left scapula suggested for further evaluation to exclude
metastatic disease. No acute bony abnormality. Left apical
pleural-parenchymal thickening noted most consistent scarring.
IMPRESSION: 1. Subtle left AC separation cannot be completely excluded. AP views
of both shoulders with and without weights can be obtained for
further evaluation as needed.

2. Questionable small sclerotic density over the left glenoid.
Whole-body bone scan with attention to the left scapula suggested to
exclude metastatic disease.

3. Glenohumeral degenerative change. No acute bony abnormality
identified. No evidence of fracture or dislocation.

4. Left apical pleural-parenchymal thickening noted consistent with
scarring.

## 2016-12-17 DIAGNOSIS — N3946 Mixed incontinence: Secondary | ICD-10-CM | POA: Diagnosis not present

## 2016-12-17 DIAGNOSIS — R350 Frequency of micturition: Secondary | ICD-10-CM | POA: Diagnosis not present

## 2016-12-30 IMAGING — NM NM BONE WHOLE BODY
2 series · 2 of 2 positions shown · non-contrast
Comparison: 12/09/2015

CLINICAL DATA: Prostate carcinoma with shoulder pain

EXAM:
NUCLEAR MEDICINE WHOLE BODY BONE SCAN
TECHNIQUE: Whole body anterior and posterior images were obtained approximately
3 hours after intravenous injection of radiopharmaceutical.
RADIOPHARMACEUTICALS:  21 mCi Fechnetium-22m MDP IV

[Series 1: whole body · 2.66mm/px · 1 of 1 slices shown (1 of 2)]
[im 1/1]
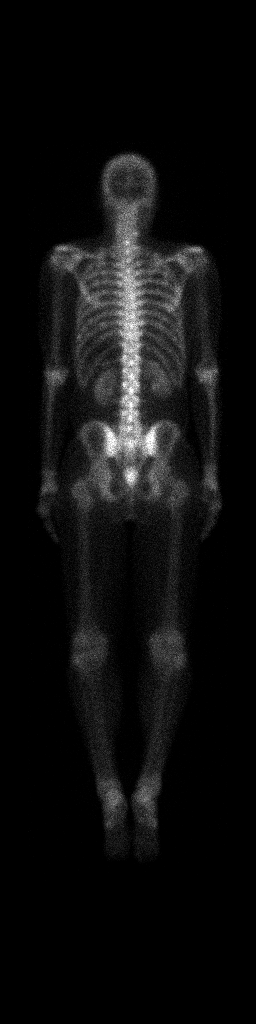

[Series 1: whole body · 2.66mm/px · 1 of 1 slices shown (2 of 2)]
[im 1/1]
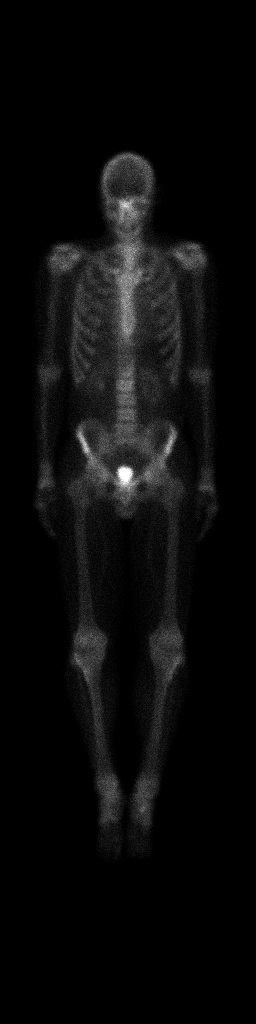

[2 of 2 positions shown; findings below may reference images not displayed]

FINDINGS: There is adequate uptake of radioactive tracer throughout the bony
skeleton. Bilateral renal activity is noted. No focus of increased
activity is identified to correspond with that seen on prior plain
film examination. The sclerotic focus is felt to be related to
overlapping first rib. Slight mottled appearance to the ribcage is
noted bilaterally. This is of uncertain significance although no
significant increased activity to suggest metastatic disease is
noted. No other areas of abnormal activity to suggest metastatic
disease are noted.
IMPRESSION: No definitive evidence of metastatic disease.

No evidence of increased activity to correspond with the previously
seen sclerotic focus in the left glenoid.

Slight mottled appearance to the rib cage bilaterally. This is of
uncertain significance but not felt to be related to metastatic
disease due to the lack of significant increased activity.

## 2017-06-03 DIAGNOSIS — C61 Malignant neoplasm of prostate: Secondary | ICD-10-CM | POA: Diagnosis not present

## 2017-06-10 DIAGNOSIS — C61 Malignant neoplasm of prostate: Secondary | ICD-10-CM | POA: Diagnosis not present

## 2017-06-10 DIAGNOSIS — N3946 Mixed incontinence: Secondary | ICD-10-CM | POA: Diagnosis not present

## 2017-06-10 DIAGNOSIS — N5201 Erectile dysfunction due to arterial insufficiency: Secondary | ICD-10-CM | POA: Diagnosis not present

## 2017-11-08 ENCOUNTER — Encounter: Payer: Self-pay | Admitting: Family Medicine

## 2017-11-08 ENCOUNTER — Ambulatory Visit (INDEPENDENT_AMBULATORY_CARE_PROVIDER_SITE_OTHER): Payer: BLUE CROSS/BLUE SHIELD | Admitting: Family Medicine

## 2017-11-08 ENCOUNTER — Other Ambulatory Visit: Payer: Self-pay

## 2017-11-08 VITALS — BP 128/78 | HR 72 | Temp 98.0°F | Ht 69.0 in | Wt 144.1 lb

## 2017-11-08 DIAGNOSIS — Z0000 Encounter for general adult medical examination without abnormal findings: Secondary | ICD-10-CM

## 2017-11-08 LAB — HEPATIC FUNCTION PANEL
ALBUMIN: 4.5 g/dL (ref 3.5–5.2)
ALT: 16 U/L (ref 0–53)
AST: 17 U/L (ref 0–37)
Alkaline Phosphatase: 55 U/L (ref 39–117)
Bilirubin, Direct: 0.3 mg/dL (ref 0.0–0.3)
TOTAL PROTEIN: 7 g/dL (ref 6.0–8.3)
Total Bilirubin: 2 mg/dL — ABNORMAL HIGH (ref 0.2–1.2)

## 2017-11-08 LAB — TSH: TSH: 3.09 u[IU]/mL (ref 0.35–4.50)

## 2017-11-08 LAB — CBC WITH DIFFERENTIAL/PLATELET
BASOS PCT: 0.7 % (ref 0.0–3.0)
Basophils Absolute: 0 10*3/uL (ref 0.0–0.1)
EOS ABS: 0.2 10*3/uL (ref 0.0–0.7)
Eosinophils Relative: 5.1 % — ABNORMAL HIGH (ref 0.0–5.0)
HEMATOCRIT: 44.8 % (ref 39.0–52.0)
Hemoglobin: 15.5 g/dL (ref 13.0–17.0)
LYMPHS PCT: 28.1 % (ref 12.0–46.0)
Lymphs Abs: 1.3 10*3/uL (ref 0.7–4.0)
MCHC: 34.6 g/dL (ref 30.0–36.0)
MCV: 89.4 fl (ref 78.0–100.0)
MONOS PCT: 7.4 % (ref 3.0–12.0)
Monocytes Absolute: 0.3 10*3/uL (ref 0.1–1.0)
NEUTROS ABS: 2.7 10*3/uL (ref 1.4–7.7)
Neutrophils Relative %: 58.7 % (ref 43.0–77.0)
PLATELETS: 186 10*3/uL (ref 150.0–400.0)
RBC: 5.01 Mil/uL (ref 4.22–5.81)
RDW: 12.8 % (ref 11.5–15.5)
WBC: 4.5 10*3/uL (ref 4.0–10.5)

## 2017-11-08 LAB — LIPID PANEL
CHOLESTEROL: 197 mg/dL (ref 0–200)
HDL: 65.5 mg/dL (ref >39.00)
LDL CALC: 100 mg/dL — AB (ref 0–99)
NonHDL: 131
TRIGLYCERIDES: 155 mg/dL — AB (ref 0.0–149.0)
Total CHOL/HDL Ratio: 3
VLDL: 31 mg/dL (ref 0.0–40.0)

## 2017-11-08 LAB — BASIC METABOLIC PANEL
BUN: 14 mg/dL (ref 6–23)
CHLORIDE: 100 meq/L (ref 96–112)
CO2: 32 mEq/L (ref 19–32)
CREATININE: 1 mg/dL (ref 0.40–1.50)
Calcium: 9.4 mg/dL (ref 8.4–10.5)
GFR: 80.23 mL/min (ref >60.00)
Glucose, Bld: 89 mg/dL (ref 70–99)
POTASSIUM: 4.2 meq/L (ref 3.5–5.1)
Sodium: 140 mEq/L (ref 135–145)

## 2017-11-08 NOTE — Patient Instructions (Signed)
Let us know if interested in pursuing the Cologuard.

## 2017-11-08 NOTE — Progress Notes (Signed)
Subjective:     Patient ID: Isaac Ramsey, male   DOB: December 13, 1955, 62 y.o.   MRN: [id number]  HPI Patient here for physical exam.  He has past history of prostate cancer and is seen yearly by urology.  He gets PSAs through their office.  He has essential tremor which is gotten slightly worse over the past year or so.  He is not ready to start medication yet.  He has struggled to keep his weight up for several years.  Has lost about 8 pounds since last visit here.  Good appetite.  Declines flu vaccines.  Tetanus up-to-date.  No history of hepatitis C screening.  No history of colonoscopy.  He is not willing to do colonoscopy at this time but does have some interest in doing Cologuard  Past Medical History:  Diagnosis Date  . Chickenpox   . Prostate cancer [facility])    Past Surgical History:  Procedure Laterality Date  . LYMPHADENECTOMY Bilateral 08/18/2014   Procedure: BILATERAL PELVIC LYMPHADENECTOMY WITH RIGHT INGUINAL HERNIA REPAIR;  Surgeon: Alexis Frock, MD;  Location: [facility];  Service: Urology;  Laterality: Bilateral;  . PROSTATE BIOPSY     3'16 -dx. prostate cancer  . ROBOT ASSISTED LAPAROSCOPIC RADICAL PROSTATECTOMY N/A 08/18/2014   Procedure: ROBOTIC ASSISTED LAPAROSCOPIC RADICAL PROSTATECTOMY WITH INDOCYANINE GREEN DYE;  Surgeon: Alexis Frock, MD;  Location: [facility];  Service: Urology;  Laterality: N/A;  . VASECTOMY      reports that he quit smoking about 33 years ago. He has never used smokeless tobacco. He reports that he does not drink alcohol or use drugs. family history includes Alcohol abuse in his brother; Arthritis in his mother; Cancer in his father; Heart disease (age of onset: 57) in his father; Hyperlipidemia in his father; Hypertension in his father and mother. No Known Allergies   Review of Systems  Constitutional: Negative for activity change, appetite change, fatigue and fever.  HENT: Negative for congestion, ear pain and trouble swallowing.   Eyes: Negative  for pain and visual disturbance.  Respiratory: Negative for cough, shortness of breath and wheezing.   Cardiovascular: Negative for chest pain and palpitations.  Gastrointestinal: Negative for abdominal distention, abdominal pain, blood in stool, constipation, diarrhea, nausea, rectal pain and vomiting.  Endocrine: Negative for polydipsia and polyuria.  Genitourinary: Negative for dysuria, hematuria and testicular pain.  Musculoskeletal: Negative for arthralgias and joint swelling.  Skin: Negative for rash.  Neurological: Positive for tremors. Negative for dizziness, seizures, syncope, weakness and headaches.  Hematological: Negative for adenopathy.  Psychiatric/Behavioral: Negative for confusion and dysphoric mood.       Objective:   Physical Exam  Constitutional: He is oriented to person, place, and time. He appears well-developed and well-nourished. No distress.  HENT:  Head: Normocephalic and atraumatic.  Right Ear: External ear normal.  Left Ear: External ear normal.  Mouth/Throat: Oropharynx is clear and moist.  Eyes: Pupils are equal, round, and reactive to light. Conjunctivae and EOM are normal.  Neck: Normal range of motion. Neck supple. No thyromegaly present.  Cardiovascular: Normal rate, regular rhythm and normal heart sounds.  No murmur heard. Pulmonary/Chest: No respiratory distress. He has no wheezes. He has no rales.  Abdominal: Soft. Bowel sounds are normal. He exhibits no distension and no mass. There is no tenderness. There is no rebound and no guarding.  Musculoskeletal: He exhibits no edema.  Lymphadenopathy:    He has no cervical adenopathy.  Neurological: He is alert and oriented to person, place, and time.  He displays normal reflexes. No cranial nerve deficit.  He does have fine tremor upper extremities which does not extinguish with movement  Skin: No rash noted.  Psychiatric: He has a normal mood and affect.       Assessment:     Physical exam.   Patient has remote history of prostate cancer followed by urology.  The following health maintenance issues were addressed    Plan:     -Check hepatitis C antibody -Obtain screening labs.  We will not check PSA since he gets this with urology -Flu vaccine offered and declined -Strongly recommend colon cancer screening.  He is interested in Cologuard.  Declines colonoscopy.  He will check on insurance coverage. -Discussed healthy strategies for weight gain -Discussed possible treatments for essential tremor and at this point he declines  Eulas Post MD [facility]

## 2017-11-09 LAB — HEPATITIS C ANTIBODY
HEP C AB: NONREACTIVE
SIGNAL TO CUT-OFF: 0.01 (ref <1.00)

## 2017-11-10 ENCOUNTER — Encounter: Payer: Self-pay | Admitting: Family Medicine

## 2018-06-05 DIAGNOSIS — C61 Malignant neoplasm of prostate: Secondary | ICD-10-CM | POA: Diagnosis not present

## 2018-06-10 DIAGNOSIS — C61 Malignant neoplasm of prostate: Secondary | ICD-10-CM | POA: Diagnosis not present

## 2018-06-10 DIAGNOSIS — N5201 Erectile dysfunction due to arterial insufficiency: Secondary | ICD-10-CM | POA: Diagnosis not present

## 2018-06-10 DIAGNOSIS — N3946 Mixed incontinence: Secondary | ICD-10-CM | POA: Diagnosis not present

## 2018-10-24 ENCOUNTER — Encounter: Payer: Self-pay | Admitting: Family Medicine

## 2018-11-21 ENCOUNTER — Other Ambulatory Visit: Payer: Self-pay

## 2018-11-24 ENCOUNTER — Encounter: Payer: Self-pay | Admitting: Family Medicine

## 2018-11-24 ENCOUNTER — Other Ambulatory Visit: Payer: Self-pay

## 2018-11-24 ENCOUNTER — Telehealth: Payer: Self-pay

## 2018-11-24 ENCOUNTER — Ambulatory Visit (INDEPENDENT_AMBULATORY_CARE_PROVIDER_SITE_OTHER): Payer: BC Managed Care – PPO | Admitting: Family Medicine

## 2018-11-24 VITALS — BP 116/76 | HR 72 | Temp 97.7°F | Ht 71.0 in | Wt 144.7 lb

## 2018-11-24 DIAGNOSIS — Z0000 Encounter for general adult medical examination without abnormal findings: Secondary | ICD-10-CM

## 2018-11-24 LAB — LIPID PANEL
Cholesterol: 194 mg/dL (ref 0–200)
HDL: 63.7 mg/dL (ref >39.00)
LDL Cholesterol: 105 mg/dL — ABNORMAL HIGH (ref 0–99)
NonHDL: 130.03
Total CHOL/HDL Ratio: 3
Triglycerides: 124 mg/dL (ref 0.0–149.0)
VLDL: 24.8 mg/dL (ref 0.0–40.0)

## 2018-11-24 LAB — CBC WITH DIFFERENTIAL/PLATELET
Basophils Absolute: 0 10*3/uL (ref 0.0–0.1)
Basophils Relative: 0.8 % (ref 0.0–3.0)
Eosinophils Absolute: 0.2 10*3/uL (ref 0.0–0.7)
Eosinophils Relative: 4.2 % (ref 0.0–5.0)
HCT: 45.3 % (ref 39.0–52.0)
Hemoglobin: 15.3 g/dL (ref 13.0–17.0)
Lymphocytes Relative: 26.1 % (ref 12.0–46.0)
Lymphs Abs: 1.2 10*3/uL (ref 0.7–4.0)
MCHC: 33.7 g/dL (ref 30.0–36.0)
MCV: 90.6 fl (ref 78.0–100.0)
Monocytes Absolute: 0.3 10*3/uL (ref 0.1–1.0)
Monocytes Relative: 7.8 % (ref 3.0–12.0)
Neutro Abs: 2.7 10*3/uL (ref 1.4–7.7)
Neutrophils Relative %: 61.1 % (ref 43.0–77.0)
Platelets: 194 10*3/uL (ref 150.0–400.0)
RBC: 5 Mil/uL (ref 4.22–5.81)
RDW: 13 % (ref 11.5–15.5)
WBC: 4.5 10*3/uL (ref 4.0–10.5)

## 2018-11-24 LAB — HEPATIC FUNCTION PANEL
ALT: 16 U/L (ref 0–53)
AST: 18 U/L (ref 0–37)
Albumin: 4 g/dL (ref 3.5–5.2)
Alkaline Phosphatase: 64 U/L (ref 39–117)
Bilirubin, Direct: 0.2 mg/dL (ref 0.0–0.3)
Total Bilirubin: 2 mg/dL — ABNORMAL HIGH (ref 0.2–1.2)
Total Protein: 6.8 g/dL (ref 6.0–8.3)

## 2018-11-24 LAB — BASIC METABOLIC PANEL
BUN: 13 mg/dL (ref 6–23)
CO2: 32 mEq/L (ref 19–32)
Calcium: 9.5 mg/dL (ref 8.4–10.5)
Chloride: 101 mEq/L (ref 96–112)
Creatinine, Ser: 0.95 mg/dL (ref 0.40–1.50)
GFR: 79.82 mL/min (ref >60.00)
Glucose, Bld: 93 mg/dL (ref 70–99)
Potassium: 4.6 mEq/L (ref 3.5–5.1)
Sodium: 140 mEq/L (ref 135–145)

## 2018-11-24 LAB — TSH: TSH: 2.49 u[IU]/mL (ref 0.35–4.50)

## 2018-11-24 NOTE — Patient Instructions (Signed)
Preventive Care 40-64 Years Old, Male Preventive care refers to lifestyle choices and visits with your health care provider that can promote health and wellness. This includes:  A yearly physical exam. This is also called an annual well check.  Regular dental and eye exams.  Immunizations.  Screening for certain conditions.  Healthy lifestyle choices, such as eating a healthy diet, getting regular exercise, not using drugs or products that contain nicotine and tobacco, and limiting alcohol use. What can I expect for my preventive care visit? Physical exam Your health care provider will check:  Height and weight. These may be used to calculate body mass index (BMI), which is a measurement that tells if you are at a healthy weight.  Heart rate and blood pressure.  Your skin for abnormal spots. Counseling Your health care provider may ask you questions about:  Alcohol, tobacco, and drug use.  Emotional well-being.  Home and relationship well-being.  Sexual activity.  Eating habits.  Work and work environment. What immunizations do I need?  Influenza (flu) vaccine  This is recommended every year. Tetanus, diphtheria, and pertussis (Tdap) vaccine  You may need a Td booster every 10 years. Varicella (chickenpox) vaccine  You may need this vaccine if you have not already been vaccinated. Zoster (shingles) vaccine  You may need this after age 60. Measles, mumps, and rubella (MMR) vaccine  You may need at least one dose of MMR if you were born in 1957 or later. You may also need a second dose. Pneumococcal conjugate (PCV13) vaccine  You may need this if you have certain conditions and were not previously vaccinated. Pneumococcal polysaccharide (PPSV23) vaccine  You may need one or two doses if you smoke cigarettes or if you have certain conditions. Meningococcal conjugate (MenACWY) vaccine  You may need this if you have certain conditions. Hepatitis A vaccine   You may need this if you have certain conditions or if you travel or work in places where you may be exposed to hepatitis A. Hepatitis B vaccine  You may need this if you have certain conditions or if you travel or work in places where you may be exposed to hepatitis B. Haemophilus influenzae type b (Hib) vaccine  You may need this if you have certain risk factors. Human papillomavirus (HPV) vaccine  If recommended by your health care provider, you may need three doses over 6 months. You may receive vaccines as individual doses or as more than one vaccine together in one shot (combination vaccines). Talk with your health care provider about the risks and benefits of combination vaccines. What tests do I need? Blood tests  Lipid and cholesterol levels. These may be checked every 5 years, or more frequently if you are over 50 years old.  Hepatitis C test.  Hepatitis B test. Screening  Lung cancer screening. You may have this screening every year starting at age 55 if you have a 30-pack-year history of smoking and currently smoke or have quit within the past 15 years.  Prostate cancer screening. Recommendations will vary depending on your family history and other risks.  Colorectal cancer screening. All adults should have this screening starting at age 50 and continuing until age 75. Your health care provider may recommend screening at age 45 if you are at increased risk. You will have tests every 1-10 years, depending on your results and the type of screening test.  Diabetes screening. This is done by checking your blood sugar (glucose) after you have not eaten   for a while (fasting). You may have this done every 1-3 years.  Sexually transmitted disease (STD) testing. Follow these instructions at home: Eating and drinking  Eat a diet that includes fresh fruits and vegetables, whole grains, lean protein, and low-fat dairy products.  Take vitamin and mineral supplements as recommended  by your health care provider.  Do not drink alcohol if your health care provider tells you not to drink.  If you drink alcohol: ? Limit how much you have to 0-2 drinks a day. ? Be aware of how much alcohol is in your drink. In the [place] one drink equals one 12 oz bottle of beer (355 mL), one 5 oz glass of wine (148 mL), or one 1 oz glass of hard liquor (44 mL). Lifestyle  Take daily care of your teeth and gums.  Stay active. Exercise for at least 30 minutes on 5 or more days each week.  Do not use any products that contain nicotine or tobacco, such as cigarettes, e-cigarettes, and chewing tobacco. If you need help quitting, ask your health care provider.  If you are sexually active, practice safe sex. Use a condom or other form of protection to prevent STIs (sexually transmitted infections).  Talk with your health care provider about taking a low-dose aspirin every day starting at age 33. What's next?  Go to your health care provider once a year for a well check visit.  Ask your health care provider how often you should have your eyes and teeth checked.  Stay up to date on all vaccines. This information is not intended to replace advice given to you by your health care provider. Make sure you discuss any questions you have with your health care provider. Document Released: 01/14/2015 Document Revised: 12/12/2017 Document Reviewed: 12/12/2017 Elsevier Patient Education  2020 [employer].

## 2018-11-24 NOTE — Progress Notes (Signed)
Subjective:     Patient ID: Isaac Ramsey, male   DOB: Nov 04, 1955, 63 y.o.   MRN: [id number]  HPI Dani is seen for physical exam.  He has history of prostate cancer has had previous prostatectomy.  He does have some urine incontinence.  He has urinary urgency and is on Toviaz for that.  He has some associated dry mouth symptoms.  He has tremor upper extremities which does not extinguish with intention.  Overall about the same.  More of an aggravation with things like drinking coffee.  Health maintenance reviewed.  He declines flu vaccination.  No history of colon cancer screening.  He is willing to consider Cologuard.  No family history of colon cancer.  Tetanus is up-to-date  Health Maintenance  Topic Date Due  . HIV Screening  11/27/1969  . COLONOSCOPY  11/27/2004  . TETANUS/TDAP  09/10/2023  . Hepatitis C Screening  Completed  . INFLUENZA VACCINE  Discontinued     Past Medical History:  Diagnosis Date  . Chickenpox   . Prostate cancer [facility])    Past Surgical History:  Procedure Laterality Date  . LYMPHADENECTOMY Bilateral 08/18/2014   Procedure: BILATERAL PELVIC LYMPHADENECTOMY WITH RIGHT INGUINAL HERNIA REPAIR;  Surgeon: Alexis Frock, MD;  Location: [facility];  Service: Urology;  Laterality: Bilateral;  . PROSTATE BIOPSY     3'16 -dx. prostate cancer  . ROBOT ASSISTED LAPAROSCOPIC RADICAL PROSTATECTOMY N/A 08/18/2014   Procedure: ROBOTIC ASSISTED LAPAROSCOPIC RADICAL PROSTATECTOMY WITH INDOCYANINE GREEN DYE;  Surgeon: Alexis Frock, MD;  Location: [facility];  Service: Urology;  Laterality: N/A;  . VASECTOMY      reports that he quit smoking about 34 years ago. He has never used smokeless tobacco. He reports that he does not drink alcohol or use drugs. family history includes Alcohol abuse in his brother; Arthritis in his mother; Cancer in his father; Heart disease (age of onset: 16) in his father; Hyperlipidemia in his father; Hypertension in his father and mother. No Known  Allergies   Review of Systems  Constitutional: Negative for activity change, appetite change, fatigue and fever.  HENT: Negative for congestion, ear pain and trouble swallowing.   Eyes: Negative for pain and visual disturbance.  Respiratory: Negative for cough, shortness of breath and wheezing.   Cardiovascular: Negative for chest pain and palpitations.  Gastrointestinal: Negative for abdominal distention, abdominal pain, blood in stool, constipation, diarrhea, nausea, rectal pain and vomiting.  Genitourinary: Negative for hematuria and testicular pain.  Musculoskeletal: Negative for arthralgias and joint swelling.  Skin: Negative for rash.  Neurological: Negative for dizziness, syncope and headaches.  Hematological: Negative for adenopathy.  Psychiatric/Behavioral: Negative for confusion and dysphoric mood.       Objective:   Physical Exam Constitutional:      General: He is not in acute distress.    Appearance: He is well-developed.  HENT:     Head: Normocephalic and atraumatic.     Right Ear: External ear normal.     Left Ear: External ear normal.  Eyes:     Conjunctiva/sclera: Conjunctivae normal.     Pupils: Pupils are equal, round, and reactive to light.  Neck:     Musculoskeletal: Normal range of motion and neck supple.     Thyroid: No thyromegaly.  Cardiovascular:     Rate and Rhythm: Normal rate and regular rhythm.     Heart sounds: Normal heart sounds. No murmur.  Pulmonary:     Effort: No respiratory distress.     Breath sounds:  No wheezing or rales.  Abdominal:     General: Bowel sounds are normal. There is no distension.     Palpations: Abdomen is soft. There is no mass.     Tenderness: There is no abdominal tenderness. There is no guarding or rebound.  Lymphadenopathy:     Cervical: No cervical adenopathy.  Skin:    Findings: No rash.  Neurological:     Mental Status: He is alert and oriented to person, place, and time.     Cranial Nerves: No cranial  nerve deficit.     Deep Tendon Reflexes: Reflexes normal.     Comments: No focal weakness.  No cogwheel rigidity.        Assessment:     Physical exam.  He has past history of prostate cancer and is still followed by urology for yearly PSAs.  He has very mild likely essential tremor    Plan:     -Discussed flu vaccine and he declines -Patient does request Cologuard.  We discussed sensitivity and specificity of this test -Obtain screening labs.  Defer PSA to urology -Continue regular exercise habits  Eulas Post MD [facility] at [facility]

## 2018-11-24 NOTE — Telephone Encounter (Signed)
Faxed Cologuard order form request today for patient per Dr. Elease Hashimoto.

## 2018-12-05 ENCOUNTER — Encounter: Payer: Self-pay | Admitting: Family Medicine

## 2018-12-05 DIAGNOSIS — Z1212 Encounter for screening for malignant neoplasm of rectum: Secondary | ICD-10-CM | POA: Diagnosis not present

## 2018-12-05 DIAGNOSIS — Z1211 Encounter for screening for malignant neoplasm of colon: Secondary | ICD-10-CM | POA: Diagnosis not present

## 2018-12-05 LAB — COLOGUARD

## 2018-12-12 ENCOUNTER — Telehealth: Payer: Self-pay | Admitting: Family Medicine

## 2018-12-12 DIAGNOSIS — R195 Other fecal abnormalities: Secondary | ICD-10-CM

## 2018-12-12 NOTE — Telephone Encounter (Signed)
Patient had positive Cologuard.  Went over results with patient.  He realizes there is approximately 10% false positive rate.  We have strongly recommended referral to GI for consideration for colonoscopy and he agrees.  Referral is made to [facility]

## 2018-12-12 NOTE — Telephone Encounter (Signed)
Danielle calling from [employer] is calling to confirm and an abnormal colloguard. Please advise [phone]-Provider Line- Order number [id number] Do not need to call back if have received the results.

## 2018-12-12 NOTE — Telephone Encounter (Signed)
Patient was notified of positive Cologuard result.  We recommended GI referral and patient agrees.  Referral has been made

## 2018-12-12 NOTE — Telephone Encounter (Signed)
Did I put the results of this patient in your red folder?

## 2018-12-16 ENCOUNTER — Ambulatory Visit: Payer: BC Managed Care – PPO | Admitting: Gastroenterology

## 2018-12-16 ENCOUNTER — Encounter: Payer: Self-pay | Admitting: Gastroenterology

## 2018-12-16 VITALS — BP 118/82 | HR 78 | Temp 98.5°F | Ht 72.0 in | Wt 149.1 lb

## 2018-12-16 DIAGNOSIS — Z1159 Encounter for screening for other viral diseases: Secondary | ICD-10-CM

## 2018-12-16 DIAGNOSIS — R195 Other fecal abnormalities: Secondary | ICD-10-CM

## 2018-12-16 MED ORDER — NA SULFATE-K SULFATE-MG SULF 17.5-3.13-1.6 GM/177ML PO SOLN: 1.0000 | ORAL | 0 refills | Status: DC

## 2018-12-16 NOTE — Patient Instructions (Addendum)
You have been scheduled for a colonoscopy. Please follow written instructions given to you at your visit today.  Please pick up your prep supplies at the pharmacy within the next 1-3 days. If you use inhalers (even only as needed), please bring them with you on the day of your procedure.  Tips for colonoscopy:  -STAY WELL HYDRATED FOR 3-4 DAYS PRIOR TO THE EXAM. This reduces nausea and dehydration.  -TO PREVENT SKIN/HEMORRHOID IRRITATION- prior to wiping, put A&Dointment or vaseline on the toilet paper. -Keep a towel or pad on the bed.  -DRINK 64oz of clear liquids in the morning of prep day (PRIOR TO STARTING THE PREP) to be sure that there is enough fluid to flush the colon and stay hydrated!!!! This is in addition to the fluids required for preparation.

## 2018-12-16 NOTE — Progress Notes (Signed)
Referring Provider: Eulas Post, MD Primary Care Physician:  Eulas Post, MD  Reason for Consultation:  + cologuard   IMPRESSION:  + cologuard No baseline GI symptoms No known family history of colon cancer or polyps  Colonoscopy recommended to evaluate the positive Cologuard.  PLAN: Colonoscopy  The nature of the procedure, as well as the risks, benefits, and alternatives were carefully and thoroughly reviewed with the patient. Ample time for discussion and questions allowed. The patient understood, was satisfied, and agreed to proceed.  Please see the "Patient Instructions" section for addition details about the plan.  HPI: Isaac Ramsey is a 63 y.o. male referred for + cologuard. The history is obtained through the patient and review of his medical record.  He has a history of prostate cancer treated with prostatectomy, radiation, and long-term Lupron injections.  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  No symptoms associated with the positive Cologuard.  There is no known anemia. No overt GI blood loss. No melena, hematochezia, bright red blood per rectum. No epistaxis, hemoptysis, or hematuria.   No identified exacerbating or relieving features.  Rare ibuprofen for headaches.   No prior colonoscopy or colon cancer screening.  Mother died at 62 and had IBS and diverticulosis. No known family history of colon cancer or polyps. No family history of uterine/endometrial cancer, pancreatic cancer or gastric/stomach cancer.   Past Medical History:  Diagnosis Date  . Chickenpox   . Prostate cancer [facility])     Past Surgical History:  Procedure Laterality Date  . LYMPHADENECTOMY Bilateral 08/18/2014   Procedure: BILATERAL PELVIC LYMPHADENECTOMY WITH RIGHT INGUINAL HERNIA REPAIR;  Surgeon: Alexis Frock, MD;  Location: [facility];  Service: Urology;  Laterality: Bilateral;  . PROSTATE BIOPSY     3'16 -dx. prostate cancer  . ROBOT ASSISTED  LAPAROSCOPIC RADICAL PROSTATECTOMY N/A 08/18/2014   Procedure: ROBOTIC ASSISTED LAPAROSCOPIC RADICAL PROSTATECTOMY WITH INDOCYANINE GREEN DYE;  Surgeon: Alexis Frock, MD;  Location: [facility];  Service: Urology;  Laterality: N/A;  . VASECTOMY      Current Outpatient Medications  Medication Sig Dispense Refill  . TOVIAZ 8 MG TB24 tablet TK 1 T PO ONCE DAILY  10   No current facility-administered medications for this visit.    Allergies as of 12/16/2018  . (No Known Allergies)    Family History  Problem Relation Age of Onset  . Alcohol abuse Brother   . Hypertension Mother   . Arthritis Mother   . Hyperlipidemia Father   . Hypertension Father   . Heart disease Father 7       CAD  . Cancer Father        bladder    Social History   Socioeconomic History  . Marital status: Married    Spouse name: Not on file  . Number of children: Not on file  . Years of education: Not on file  . Highest education level: Not on file  Occupational History  . Not on file  Tobacco Use  . Smoking status: Former Smoker    Quit date: 01/02/1984    Years since quitting: 34.9  . Smokeless tobacco: Never Used  Substance and Sexual Activity  . Alcohol use: No  . Drug use: No  . Sexual activity: Yes  Other Topics Concern  . Not on file  Social History Narrative  . Not on file   Social Determinants of Health   Financial Resource Strain:   . Difficulty of Paying Living  Expenses: Not on file  Food Insecurity:   . Worried About Charity fundraiser in the Last Year: Not on file  . Ran Out of Food in the Last Year: Not on file  Transportation Needs:   . Lack of Transportation (Medical): Not on file  . Lack of Transportation (Non-Medical): Not on file  Physical Activity:   . Days of Exercise per Week: Not on file  . Minutes of Exercise per Session: Not on file  Stress:   . Feeling of Stress : Not on file  Social Connections:   . Frequency of Communication with Friends and Family: Not on  file  . Frequency of Social Gatherings with Friends and Family: Not on file  . Attends Religious Services: Not on file  . Active Member of Clubs or Organizations: Not on file  . Attends Archivist Meetings: Not on file  . Marital Status: Not on file  Intimate Partner Violence:   . Fear of Current or Ex-Partner: Not on file  . Emotionally Abused: Not on file  . Physically Abused: Not on file  . Sexually Abused: Not on file    Review of Systems: 12 system ROS is negative except as noted above with the addition of urinary incontinence.  He wears a pad.  Physical Exam: General:   Alert,  well-nourished, pleasant and cooperative in NAD Head:  Normocephalic and atraumatic. Eyes:  Sclera clear, no icterus.   Conjunctiva pink. Ears:  Normal auditory acuity. Nose:  No deformity, discharge,  or lesions. Mouth:  No deformity or lesions.   Neck:  Supple; no masses or thyromegaly. Lungs:  Clear throughout to auscultation.   No wheezes. Heart:  Regular rate and rhythm; no murmurs. Abdomen:  Soft,nontender, nondistended, normal bowel sounds, no rebound or guarding. No hepatosplenomegaly.   Rectal:  Deferred  Msk:  Symmetrical. No boney deformities LAD: No inguinal or umbilical LAD Extremities:  No clubbing or edema. Neurologic:  Alert and  oriented x4;  grossly nonfocal Skin:  Intact without significant lesions or rashes. Psych:  Alert and cooperative. Normal mood and affect.    Sheamus Hasting L. Tarri Glenn, MD, MPH 12/16/2018, 3:21 PM

## 2019-01-07 ENCOUNTER — Encounter: Payer: Self-pay | Admitting: Gastroenterology

## 2019-01-09 ENCOUNTER — Other Ambulatory Visit: Payer: Self-pay | Admitting: Gastroenterology

## 2019-01-09 ENCOUNTER — Ambulatory Visit (INDEPENDENT_AMBULATORY_CARE_PROVIDER_SITE_OTHER): Payer: BC Managed Care – PPO

## 2019-01-09 DIAGNOSIS — Z1159 Encounter for screening for other viral diseases: Secondary | ICD-10-CM

## 2019-01-12 LAB — SARS CORONAVIRUS 2 (TAT 6-24 HRS): SARS Coronavirus 2: NEGATIVE

## 2019-01-13 ENCOUNTER — Ambulatory Visit (AMBULATORY_SURGERY_CENTER): Payer: BC Managed Care – PPO | Admitting: Gastroenterology

## 2019-01-13 ENCOUNTER — Other Ambulatory Visit: Payer: Self-pay

## 2019-01-13 ENCOUNTER — Encounter: Payer: Self-pay | Admitting: Gastroenterology

## 2019-01-13 VITALS — BP 106/69 | HR 63 | Temp 98.2°F | Resp 12 | Ht 72.0 in | Wt 149.0 lb

## 2019-01-13 DIAGNOSIS — D123 Benign neoplasm of transverse colon: Secondary | ICD-10-CM | POA: Diagnosis not present

## 2019-01-13 DIAGNOSIS — R195 Other fecal abnormalities: Secondary | ICD-10-CM | POA: Diagnosis not present

## 2019-01-13 DIAGNOSIS — D125 Benign neoplasm of sigmoid colon: Secondary | ICD-10-CM | POA: Diagnosis not present

## 2019-01-13 DIAGNOSIS — D120 Benign neoplasm of cecum: Secondary | ICD-10-CM

## 2019-01-13 DIAGNOSIS — K5140 Inflammatory polyps of colon without complications: Secondary | ICD-10-CM

## 2019-01-13 MED ORDER — SODIUM CHLORIDE 0.9 % IV SOLN: 500.0000 mL | Freq: Once | INTRAVENOUS | Status: DC

## 2019-01-13 NOTE — Op Note (Signed)
[facility] Patient Name: Isaac Ramsey Procedure Date: 01/13/2019 7:23 AM MRN: [id number] Endoscopist: Thornton Park MD, MD Age: 64 Referring MD:  Date of Birth: [birth year] Gender: Male Account #: [id number] Procedure:                Colonoscopy Indications:              Positive Cologuard test                           No baseline GI symptoms                           No known family history of colon cancer or polyps Medicines:                Monitored Anesthesia Care Procedure:                Pre-Anesthesia Assessment:                           - Prior to the procedure, a History and Physical                            was performed, and patient medications and                            allergies were reviewed. The patient's tolerance of                            previous anesthesia was also reviewed. The risks                            and benefits of the procedure and the sedation                            options and risks were discussed with the patient.                            All questions were answered, and informed consent                            was obtained. Prior Anticoagulants: The patient has                            taken no previous anticoagulant or antiplatelet                            agents. ASA Grade Assessment: II - A patient with                            mild systemic disease. After reviewing the risks                            and benefits, the patient was deemed in  satisfactory condition to undergo the procedure.                           After obtaining informed consent, the colonoscope                            was passed under direct vision. Throughout the                            procedure, the patient's blood pressure, pulse, and                            oxygen saturations were monitored continuously. The                            Colonoscope was introduced through the anus and                  advanced to the 3 cm into the ileum. A second                            forward view of the right colon was performed. The                            colonoscopy was performed without difficulty. The                            patient tolerated the procedure well. The quality                            of the bowel preparation was excellent. The                            terminal ileum, ileocecal valve, appendiceal                            orifice, and rectum were photographed. Scope In: 8:03:21 AM Scope Out: 8:18:23 AM Scope Withdrawal Time: 0 hours 12 minutes 20 seconds  Total Procedure Duration: 0 hours 15 minutes 2 seconds  Findings:                 Non-bleeding external and internal hemorrhoids were                            found.                           Two sessile polyps were found in the cecum. The                            polyps were 2 to 3 mm in size. These polyps were                            removed with a cold snare. Resection and retrieval  were complete. Estimated blood loss was minimal.                           A 10-12 mm polyp was found in the mid transverse                            colon. The polyp was flat and serpigenous. The                            polyp was removed with a cold snare in one piece.                            Resection and retrieval were complete. Estimated                            blood loss was minimal.                           A 3 mm polyp was found in the sigmoid colon. The                            polyp was sessile. The polyp was removed with a                            cold snare. Resection and retrieval were complete.                            Estimated blood loss was minimal.                           The exam was otherwise without abnormality on                            direct and retroflexion views. Complications:            No immediate complications. Estimated blood loss:                             Minimal. Estimated Blood Loss:     Estimated blood loss was minimal. Impression:               - Non-bleeding external and internal hemorrhoids.                           - Two 2 to 3 mm polyps in the cecum, removed with a                            cold snare. Resected and retrieved.                           - One 10-12 mm polyp in the mid transverse colon,                            removed with a cold  snare. Resected and retrieved.                           - One 3 mm polyp in the sigmoid colon, removed with                            a cold snare. Resected and retrieved.                           - The examination was otherwise normal on direct                            and retroflexion views. Recommendation:           - Patient has a contact number available for                            emergencies. The signs and symptoms of potential                            delayed complications were discussed with the                            patient. Return to normal activities tomorrow.                            Written discharge instructions were provided to the                            patient.                           - Resume previous diet.                           - Continue present medications.                           - Await pathology results.                           - Repeat colonoscopy date to be determined after                            pending pathology results are reviewed for                            surveillance. Thornton Park MD, MD 01/13/2019 8:26:10 AM This report has been signed electronically.

## 2019-01-13 NOTE — Progress Notes (Signed)
PT taken to PACU. Monitors in place. VSS. Report given to RN. 

## 2019-01-13 NOTE — Progress Notes (Signed)
Called to room to assist during endoscopic procedure.  Patient ID and intended procedure confirmed with present staff. Received instructions for my participation in the procedure from the performing physician.  

## 2019-01-13 NOTE — Progress Notes (Signed)
[facility]

## 2019-01-13 NOTE — Patient Instructions (Signed)
YOU HAD AN ENDOSCOPIC PROCEDURE TODAY AT THE [facility]:   Refer to the procedure report that was given to you for any specific questions about what was found during the examination.  If the procedure report does not answer your questions, please call your gastroenterologist to clarify.  If you requested that your care partner not be given the details of your procedure findings, then the procedure report has been included in a sealed envelope for you to review at your convenience later.  YOU SHOULD EXPECT: Some feelings of bloating in the abdomen. Passage of more gas than usual.  Walking can help get rid of the air that was put into your GI tract during the procedure and reduce the bloating. If you had a lower endoscopy (such as a colonoscopy or flexible sigmoidoscopy) you may notice spotting of blood in your stool or on the toilet paper. If you underwent a bowel prep for your procedure, you may not have a normal bowel movement for a few days.  Please Note:  You might notice some irritation and congestion in your nose or some drainage.  This is from the oxygen used during your procedure.  There is no need for concern and it should clear up in a day or so.  SYMPTOMS TO REPORT IMMEDIATELY:   Following lower endoscopy (colonoscopy or flexible sigmoidoscopy):  Excessive amounts of blood in the stool  Significant tenderness or worsening of abdominal pains  Swelling of the abdomen that is new, acute  Fever of 100F or higher   Following upper endoscopy (EGD)  Vomiting of blood or coffee ground material  New chest pain or pain under the shoulder blades  Painful or persistently difficult swallowing  New shortness of breath  Fever of 100F or higher  Black, tarry-looking stools  For urgent or emergent issues, a gastroenterologist can be reached at any hour by calling [phone].   DIET:  We do recommend a small meal at first, but then you may proceed to your regular diet.  Drink  plenty of fluids but you should avoid alcoholic beverages for 24 hours.  ACTIVITY:  You should plan to take it easy for the rest of today and you should NOT DRIVE or use heavy machinery until tomorrow (because of the sedation medicines used during the test).    FOLLOW UP: Our staff will call the number listed on your records 48-72 hours following your procedure to check on you and address any questions or concerns that you may have regarding the information given to you following your procedure. If we do not reach you, we will leave a message.  We will attempt to reach you two times.  During this call, we will ask if you have developed any symptoms of COVID 19. If you develop any symptoms (ie: fever, flu-like symptoms, shortness of breath, cough etc.) before then, please call [phone].  If you test positive for Covid 19 in the 2 weeks post procedure, please call and report this information to us.    If any biopsies were taken you will be contacted by phone or by letter within the next 1-3 weeks.  Please call us at [phone] if you have not heard about the biopsies in 3 weeks.    SIGNATURES/CONFIDENTIALITY: You and/or your care partner have signed paperwork which will be entered into your electronic medical record.  These signatures attest to the fact that that the information above on your After Visit Summary has been reviewed and is   understood.  Full responsibility of the confidentiality of this discharge information lies with you and/or your care-partner.   Resume medications. Information given on polyps and hemorrhoids.

## 2019-01-14 ENCOUNTER — Encounter: Payer: Self-pay | Admitting: Family Medicine

## 2019-01-15 ENCOUNTER — Telehealth: Payer: Self-pay | Admitting: *Deleted

## 2019-01-15 NOTE — Telephone Encounter (Signed)
1. Have you developed a fever since your procedure? no  2.   Have you had an respiratory symptoms (SOB or cough) since your procedure? no  3.   Have you tested positive for COVID 19 since your procedure no  4.   Have you had any family members/close contacts diagnosed with the COVID 19 since your procedure?  no   If yes to any of these questions please route to Joylene John, RN and Alphonsa Gin, Therapist, sports.     Follow up Call-  Call back number 01/13/2019  Post procedure Call Back phone  # [phone] cell  Permission to leave phone message Yes  Some recent data might be hidden     Patient questions:  Do you have a fever, pain , or abdominal swelling? No. Pain Score  0 *  Have you tolerated food without any problems? Yes.    Have you been able to return to your normal activities? Yes.    Do you have any questions about your discharge instructions: Diet   No. Medications  No. Follow up visit  No.  Do you have questions or concerns about your Care? No.  Actions: * If pain score is 4 or above: No action needed, pain <4.

## 2019-01-21 ENCOUNTER — Encounter: Payer: Self-pay | Admitting: Gastroenterology

## 2019-02-24 DIAGNOSIS — C61 Malignant neoplasm of prostate: Secondary | ICD-10-CM | POA: Diagnosis not present

## 2019-02-24 DIAGNOSIS — N3946 Mixed incontinence: Secondary | ICD-10-CM | POA: Diagnosis not present

## 2019-02-24 DIAGNOSIS — N5201 Erectile dysfunction due to arterial insufficiency: Secondary | ICD-10-CM | POA: Diagnosis not present

## 2019-03-05 DIAGNOSIS — N5201 Erectile dysfunction due to arterial insufficiency: Secondary | ICD-10-CM | POA: Diagnosis not present

## 2019-05-03 DIAGNOSIS — R05 Cough: Secondary | ICD-10-CM | POA: Diagnosis not present

## 2019-05-03 DIAGNOSIS — J111 Influenza due to unidentified influenza virus with other respiratory manifestations: Secondary | ICD-10-CM | POA: Diagnosis not present

## 2019-05-30 ENCOUNTER — Encounter: Payer: Self-pay | Admitting: Family Medicine

## 2019-05-30 DIAGNOSIS — G250 Essential tremor: Secondary | ICD-10-CM

## 2019-06-02 DIAGNOSIS — C61 Malignant neoplasm of prostate: Secondary | ICD-10-CM | POA: Diagnosis not present

## 2019-06-03 ENCOUNTER — Encounter: Payer: Self-pay | Admitting: Neurology

## 2019-06-08 DIAGNOSIS — C61 Malignant neoplasm of prostate: Secondary | ICD-10-CM | POA: Diagnosis not present

## 2019-06-08 DIAGNOSIS — N5201 Erectile dysfunction due to arterial insufficiency: Secondary | ICD-10-CM | POA: Diagnosis not present

## 2019-06-08 DIAGNOSIS — N3946 Mixed incontinence: Secondary | ICD-10-CM | POA: Diagnosis not present

## 2019-06-18 NOTE — Progress Notes (Signed)
Assessment/Plan:   1.  Essential Tremor.   -This is evidenced by the symmetrical nature and longstanding hx of gradually getting worse.  We discussed nature and pathophysiology.  Pt education provided.  We discussed that this can continue to gradually get worse with time.  We discussed that some medications can worsen this, as can caffeine use.  We discussed medication therapy as well as surgical therapy.  We discussed prn therapies like propranolol.  Ultimately, the patient decided to start primidone and work to 50 mg q hs.  R/B/SE were discussed.  The opportunity to ask questions was given and they were answered to the best of my ability.  The patient expressed understanding and willingness to follow the outlined treatment protocols.  2.  F/u 6 months.     Subjective:   Isaac Ramsey was seen in consultation in the movement disorder clinic at the request of Eulas Post, MD.  The evaluation is for tremor.  Medical records made available to me are reviewed.  There are very few notes about tremor.  When going through the chart, I noted a note from 2014 about tremor, at which point patient was not interested in medication.  Tremor has gotten worse over the years.  It involves the bilateral UE, R more than L.  He is R hand dominant.  It is worse with fine motor movement and with muscle fatigue.  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  There is no family hx of tremor.    Affected by caffeine:  Yes.   (drinks 3 large cups coffee per day; 1 glass of tea per day) Affected by alcohol: doesn't drink alcohol Affected by stress:  Yes.   Affected by fatigue:  Yes.   Spills soup if on spoon:  May or may not (but usually eats it close) Spills glass of liquid if full:  Yes.  , and often drinks with the L hand b/c of it Affects ADL's (tying shoes, brushing teeth, etc):  No. (and still shaves with blade)  Current/Previously tried tremor medications: n/a  Current  medications that may exacerbate tremor:  none  Outside reports reviewed: historical medical records, lab reports, office notes and referral letter/letters.  No prior brain neuroimaging  No Known Allergies  Current Outpatient Medications  Medication Instructions  . TOVIAZ 8 MG TB24 tablet TK 1 T PO ONCE DAILY     Objective:   VITALS:   Vitals:   06/23/19 0812  BP: 126/87  Pulse: 76  SpO2: 98%  Weight: 146 lb (66.2 kg)  Height: 6' (1.829 m)   Gen:  Appears stated age and in NAD. HEENT:  Normocephalic, atraumatic. The mucous membranes are moist. The superficial temporal arteries are without ropiness or tenderness. Cardiovascular: Regular rate and rhythm. Lungs: Clear to auscultation bilaterally. Neck: There are no carotid bruits noted bilaterally.  NEUROLOGICAL:  Orientation:  The patient is alert and oriented x 3.   Cranial nerves: There is good facial symmetry. Extraocular muscles are intact and visual fields are full to confrontational testing. Speech is fluent and clear. Soft palate rises symmetrically and there is no tongue deviation. Hearing is intact to conversational tone. Tone: Tone is good throughout. Sensation: Sensation is intact to light touch touch throughout (facial, trunk, extremities). Vibration is intact at the bilateral big toe. There is no extinction with double simultaneous stimulation. There is no sensory dermatomal level identified. Coordination:  The patient has no dysdiadichokinesia or  dysmetria. Motor: Strength is 5/5 in the bilateral upper and lower extremities.  Shoulder shrug is equal bilaterally.  There is no pronator drift.  There are no fasciculations noted. DTR's: Deep tendon reflexes are 2-/4 at the bilateral biceps, triceps, brachioradialis, patella and achilles.  Plantar responses are downgoing bilaterally. Gait and Station: The patient is able to ambulate without difficulty. The patient is able to heel toe walk without any difficulty. The  patient is able to ambulate in a tandem fashion. The patient is able to stand in the Romberg position.   MOVEMENT EXAM: Tremor:  There is mild tremor in the UE, noted most significantly with action, R>L.  There is occ tremor in the R leg.  The patient is able to draw Archimedes spirals without significant difficulty but tremor is evident.  There is no tremor at rest.  The patient is able to pour water from one glass to another without spilling it but tremor is present  I have reviewed and interpreted the following labs independently   Chemistry      Component Value Date/Time   NA 140 11/24/2018 1104   K 4.6 11/24/2018 1104   CL 101 11/24/2018 1104   CO2 32 11/24/2018 1104   BUN 13 11/24/2018 1104   CREATININE 0.95 11/24/2018 1104   CREATININE 0.98 05/12/2010 1627      Component Value Date/Time   CALCIUM 9.5 11/24/2018 1104   ALKPHOS 64 11/24/2018 1104   AST 18 11/24/2018 1104   ALT 16 11/24/2018 1104   BILITOT 2.0 (H) 11/24/2018 1104      Lab Results  Component Value Date   WBC 4.5 11/24/2018   HGB 15.3 11/24/2018   HCT 45.3 11/24/2018   MCV 90.6 11/24/2018   PLT 194.0 11/24/2018   Lab Results  Component Value Date   TSH 2.49 11/24/2018     CC:  Eulas Post, MD

## 2019-06-23 ENCOUNTER — Other Ambulatory Visit: Payer: Self-pay

## 2019-06-23 ENCOUNTER — Ambulatory Visit: Payer: BC Managed Care – PPO | Admitting: Neurology

## 2019-06-23 ENCOUNTER — Encounter: Payer: Self-pay | Admitting: Neurology

## 2019-06-23 VITALS — BP 126/87 | HR 76 | Ht 72.0 in | Wt 146.0 lb

## 2019-06-23 DIAGNOSIS — G250 Essential tremor: Secondary | ICD-10-CM | POA: Diagnosis not present

## 2019-06-23 MED ORDER — PRIMIDONE 50 MG PO TABS: 50.0000 mg | ORAL_TABLET | Freq: Every day | ORAL | 1 refills | Status: DC

## 2019-06-23 NOTE — Patient Instructions (Signed)
Start primidone 50 mg - 1/2 tablet at bedtime for 1 week and then increase to 1 tablet at bedtime thereafter.  The physicians and staff at [facility] are committed to providing excellent care. You may receive a survey requesting feedback about your experience at our office. We strive to receive "very good" responses to the survey questions. If you feel that your experience would prevent you from giving the office a "very good " response, please contact our office to try to remedy the situation. We may be reached at [phone]. Thank you for taking the time out of your busy day to complete the survey.  

## 2019-12-15 ENCOUNTER — Other Ambulatory Visit: Payer: Self-pay | Admitting: Neurology

## 2019-12-16 ENCOUNTER — Encounter: Payer: BC Managed Care – PPO | Admitting: Family Medicine

## 2019-12-16 NOTE — Telephone Encounter (Signed)
Contacted the patient to see if he is currently taking the medication. He states he is not. prescription request has been denied.

## 2019-12-21 NOTE — Progress Notes (Deleted)
Assessment/Plan:    1.  Essential Tremor  ***  Subjective:   Isaac Ramsey was seen today in follow up for essential tremor.  My previous records were reviewed prior to todays visit. Pt denies falls.  Pt denies lightheadedness, near syncope.  Started last visit on primidone and emailed me a few weeks later stating that he did not want to take it any longer as he noticed a reduction in his energy  Current prescribed movement disorder medications: None   PREVIOUS MEDICATIONS: Primidone, 50 mg at bed (patient stopped it after 3 weeks, noting drop in energy level)  ALLERGIES:  No Known Allergies  CURRENT MEDICATIONS:  Outpatient Encounter Medications as of 12/31/2019  Medication Sig  . TOVIAZ 8 MG TB24 tablet TK 1 T PO ONCE DAILY   No facility-administered encounter medications on file as of 12/31/2019.     Objective:    PHYSICAL EXAMINATION:    VITALS:  There were no vitals filed for this visit.  GEN:  The patient appears stated age and is in NAD. HEENT:  Normocephalic, atraumatic.  The mucous membranes are moist. The superficial temporal arteries are without ropiness or tenderness. CV:  RRR Lungs:  CTAB Neck/HEME:  There are no carotid bruits bilaterally.  Neurological examination:  Orientation: The patient is alert and oriented x3. Cranial nerves: There is good facial symmetry. The speech is fluent and clear. Soft palate rises symmetrically and there is no tongue deviation. Hearing is intact to conversational tone. Sensation: Sensation is intact to light touch throughout Motor: Strength is at least antigravity x4.  Movement examination: Tone: There is normal tone in the UE/LE Abnormal movements: *** Coordination:  There is *** decremation with RAM's, *** Gait and Station: The patient has *** difficulty arising out of a deep-seated chair without the use of the hands. The patient's stride length is good I have reviewed and interpreted the following labs  independently   Chemistry      Component Value Date/Time   NA 140 11/24/2018 1104   K 4.6 11/24/2018 1104   CL 101 11/24/2018 1104   CO2 32 11/24/2018 1104   BUN 13 11/24/2018 1104   CREATININE 0.95 11/24/2018 1104   CREATININE 0.98 05/12/2010 1627      Component Value Date/Time   CALCIUM 9.5 11/24/2018 1104   ALKPHOS 64 11/24/2018 1104   AST 18 11/24/2018 1104   ALT 16 11/24/2018 1104   BILITOT 2.0 (H) 11/24/2018 1104      Lab Results  Component Value Date   WBC 4.5 11/24/2018   HGB 15.3 11/24/2018   HCT 45.3 11/24/2018   MCV 90.6 11/24/2018   PLT 194.0 11/24/2018   Lab Results  Component Value Date   TSH 2.49 11/24/2018     Chemistry      Component Value Date/Time   NA 140 11/24/2018 1104   K 4.6 11/24/2018 1104   CL 101 11/24/2018 1104   CO2 32 11/24/2018 1104   BUN 13 11/24/2018 1104   CREATININE 0.95 11/24/2018 1104   CREATININE 0.98 05/12/2010 1627      Component Value Date/Time   CALCIUM 9.5 11/24/2018 1104   ALKPHOS 64 11/24/2018 1104   AST 18 11/24/2018 1104   ALT 16 11/24/2018 1104   BILITOT 2.0 (H) 11/24/2018 1104         Total time spent on today's visit was ***30 minutes, including both face-to-face time and nonface-to-face time.  Time included that spent on review of records (prior  notes available to me/labs/imaging if pertinent), discussing treatment and goals, answering patient's questions and coordinating care.  Cc:  Eulas Post, MD

## 2019-12-31 ENCOUNTER — Ambulatory Visit: Payer: BC Managed Care – PPO | Admitting: Neurology

## 2020-01-05 ENCOUNTER — Other Ambulatory Visit: Payer: Self-pay

## 2020-01-05 ENCOUNTER — Encounter: Payer: Self-pay | Admitting: Family Medicine

## 2020-01-05 ENCOUNTER — Ambulatory Visit (INDEPENDENT_AMBULATORY_CARE_PROVIDER_SITE_OTHER): Payer: BC Managed Care – PPO | Admitting: Family Medicine

## 2020-01-05 VITALS — BP 112/78 | HR 67 | Ht 72.0 in | Wt 148.0 lb

## 2020-01-05 DIAGNOSIS — Z0000 Encounter for general adult medical examination without abnormal findings: Secondary | ICD-10-CM

## 2020-01-05 LAB — CBC WITH DIFFERENTIAL/PLATELET
Basophils Absolute: 0 10*3/uL (ref 0.0–0.1)
Basophils Relative: 0.6 % (ref 0.0–3.0)
Eosinophils Absolute: 0.2 10*3/uL (ref 0.0–0.7)
Eosinophils Relative: 5.2 % — ABNORMAL HIGH (ref 0.0–5.0)
HCT: 43.7 % (ref 39.0–52.0)
Hemoglobin: 14.4 g/dL (ref 13.0–17.0)
Lymphocytes Relative: 22.2 % (ref 12.0–46.0)
Lymphs Abs: 1 10*3/uL (ref 0.7–4.0)
MCHC: 33.1 g/dL (ref 30.0–36.0)
MCV: 90 fl (ref 78.0–100.0)
Monocytes Absolute: 0.4 10*3/uL (ref 0.1–1.0)
Monocytes Relative: 8.6 % (ref 3.0–12.0)
Neutro Abs: 3 10*3/uL (ref 1.4–7.7)
Neutrophils Relative %: 63.4 % (ref 43.0–77.0)
Platelets: 207 10*3/uL (ref 150.0–400.0)
RBC: 4.85 Mil/uL (ref 4.22–5.81)
RDW: 12.9 % (ref 11.5–15.5)
WBC: 4.7 10*3/uL (ref 4.0–10.5)

## 2020-01-05 LAB — LIPID PANEL
Cholesterol: 195 mg/dL (ref 0–200)
HDL: 55.3 mg/dL (ref >39.00)
LDL Cholesterol: 110 mg/dL — ABNORMAL HIGH (ref 0–99)
NonHDL: 140.11
Total CHOL/HDL Ratio: 4
Triglycerides: 151 mg/dL — ABNORMAL HIGH (ref 0.0–149.0)
VLDL: 30.2 mg/dL (ref 0.0–40.0)

## 2020-01-05 LAB — HEPATIC FUNCTION PANEL
ALT: 27 U/L (ref 0–53)
AST: 23 U/L (ref 0–37)
Albumin: 4.4 g/dL (ref 3.5–5.2)
Alkaline Phosphatase: 66 U/L (ref 39–117)
Bilirubin, Direct: 0.2 mg/dL (ref 0.0–0.3)
Total Bilirubin: 1.7 mg/dL — ABNORMAL HIGH (ref 0.2–1.2)
Total Protein: 6.9 g/dL (ref 6.0–8.3)

## 2020-01-05 LAB — BASIC METABOLIC PANEL
BUN: 17 mg/dL (ref 6–23)
CO2: 33 mEq/L — ABNORMAL HIGH (ref 19–32)
Calcium: 9.3 mg/dL (ref 8.4–10.5)
Chloride: 99 mEq/L (ref 96–112)
Creatinine, Ser: 1.05 mg/dL (ref 0.40–1.50)
GFR: 74.7 mL/min (ref >60.00)
Glucose, Bld: 86 mg/dL (ref 70–99)
Potassium: 4.3 mEq/L (ref 3.5–5.1)
Sodium: 138 mEq/L (ref 135–145)

## 2020-01-05 LAB — TSH: TSH: 2.7 u[IU]/mL (ref 0.35–4.50)

## 2020-01-05 MED ORDER — MUPIROCIN 2 % EX OINT: 1.0000 "application " | TOPICAL_OINTMENT | Freq: Two times a day (BID) | CUTANEOUS | 2 refills | Status: DC

## 2020-01-05 NOTE — Progress Notes (Signed)
Mychart message sent: Labs all stable.  No significant concerns

## 2020-01-05 NOTE — Progress Notes (Signed)
Established Patient Office Visit  Subjective:  Patient ID: Isaac Ramsey, male    DOB: [birth year]  Age: 65 y.o. MRN: [id number]  CC:  Chief Complaint  Patient presents with  . Annual Exam    HPI Isaac Ramsey presents for physical exam.  He has history of prostate cancer has had previous robotic assisted laparoscopic radical prostatectomy.  He is followed yearly by urology and is getting follow-up PSAs through them.  He has history of essential tremor.  Had been on primidone and also taking Toviaz for his urine urgency but he had concerns about possible relation to dementia with both medications.  He quit both of them within the past year.  He has frequent dry nasal passages in the wintertime and sometimes has crusted irritation within the naris left side.  Health maintenance reviewed  -Declines flu vaccines -Prior hepatitis C screen negative -Tetanus due 2025 -Prevnar 13 due but patient declines -Colonoscopy due 01/12/2022 -Initial Covid vaccines complete.  Not due for booster yet -No history of shingles vaccine  Social history-he is married.  This is a second marriage.  He has 1 daughter and 2 sons.  Stays in touch with daughter.  He works as a Programmer, [employer].  Quit smoking 1984.  No regular alcohol.  Family history-mother had late onset dementia age 19.  Died age 19 complications of dementia.  Father had multiple medical problems including history of bladder cancer and died related to coronary artery disease mid 3s.  He has 2 brothers that struggle with alcoholism  Past Medical History:  Diagnosis Date  . Chickenpox   . Prostate cancer [facility])     Past Surgical History:  Procedure Laterality Date  . LYMPHADENECTOMY Bilateral 08/18/2014   Procedure: BILATERAL PELVIC LYMPHADENECTOMY WITH RIGHT INGUINAL HERNIA REPAIR;  Surgeon: Alexis Frock, MD;  Location: [facility];  Service: Urology;  Laterality: Bilateral;  . PROSTATE BIOPSY     3'16 -dx. prostate cancer  .  ROBOT ASSISTED LAPAROSCOPIC RADICAL PROSTATECTOMY N/A 08/18/2014   Procedure: ROBOTIC ASSISTED LAPAROSCOPIC RADICAL PROSTATECTOMY WITH INDOCYANINE GREEN DYE;  Surgeon: Alexis Frock, MD;  Location: [facility];  Service: Urology;  Laterality: N/A;  . VASECTOMY      Family History  Problem Relation Age of Onset  . Alcohol abuse Brother   . Hypertension Mother   . Arthritis Mother   . Dementia Mother   . Hyperlipidemia Father   . Hypertension Father   . Heart disease Father 65       CAD  . Cancer Father        bladder  . Alcohol abuse Brother   . Esophageal cancer Neg Hx   . Stomach cancer Neg Hx   . Pancreatic cancer Neg Hx   . Colon cancer Neg Hx   . Rectal cancer Neg Hx     Social History   Socioeconomic History  . Marital status: Married    Spouse name: Not on file  . Number of children: Not on file  . Years of education: Not on file  . Highest education level: Not on file  Occupational History  . Not on file  Tobacco Use  . Smoking status: Former Smoker    Types: Cigarettes    Quit date: 01/01/1982    Years since quitting: 38.0  . Smokeless tobacco: Never Used  Vaping Use  . Vaping Use: Never used  Substance and Sexual Activity  . Alcohol use: No  . Drug use: No  . Sexual activity:  Yes  Other Topics Concern  . Not on file  Social History Narrative   Right handed   Social Determinants of Health   Financial Resource Strain: Not on file  Food Insecurity: Not on file  Transportation Needs: Not on file  Physical Activity: Not on file  Stress: Not on file  Social Connections: Not on file  Intimate Partner Violence: Not on file    Outpatient Medications Prior to Visit  Medication Sig Dispense Refill  . TOVIAZ 8 MG TB24 tablet TK 1 T PO ONCE DAILY  10   No facility-administered medications prior to visit.    No Known Allergies  ROS Review of Systems  Constitutional: Negative for activity change, appetite change, fatigue, fever and unexpected weight  change.  HENT: Negative for congestion, ear pain and trouble swallowing.   Eyes: Negative for pain and visual disturbance.  Respiratory: Negative for cough, shortness of breath and wheezing.   Cardiovascular: Negative for chest pain and palpitations.  Gastrointestinal: Negative for abdominal distention, abdominal pain, blood in stool, constipation, diarrhea, nausea, rectal pain and vomiting.  Genitourinary: Negative for dysuria, hematuria and testicular pain.  Musculoskeletal: Negative for arthralgias and joint swelling.  Skin: Negative for rash.  Neurological: Negative for dizziness, syncope and headaches.  Hematological: Negative for adenopathy.  Psychiatric/Behavioral: Negative for confusion and dysphoric mood.      Objective:    Physical Exam Constitutional:      General: He is not in acute distress.    Appearance: He is well-developed and well-nourished.  HENT:     Head: Normocephalic and atraumatic.     Right Ear: External ear normal.     Left Ear: External ear normal.     Mouth/Throat:     Mouth: Oropharynx is clear and moist.  Eyes:     Extraocular Movements: EOM normal.     Conjunctiva/sclera: Conjunctivae normal.     Pupils: Pupils are equal, round, and reactive to light.  Neck:     Thyroid: No thyromegaly.  Cardiovascular:     Rate and Rhythm: Normal rate and regular rhythm.     Heart sounds: Normal heart sounds. No murmur heard.   Pulmonary:     Effort: No respiratory distress.     Breath sounds: No wheezing or rales.  Abdominal:     General: Bowel sounds are normal. There is no distension.     Palpations: Abdomen is soft. There is no mass.     Tenderness: There is no abdominal tenderness. There is no guarding or rebound.  Musculoskeletal:        General: No edema.     Cervical back: Normal range of motion and neck supple.  Lymphadenopathy:     Cervical: No cervical adenopathy.  Skin:    Findings: No rash.  Neurological:     Mental Status: He is alert  and oriented to person, place, and time.     Cranial Nerves: No cranial nerve deficit.  Psychiatric:        Mood and Affect: Mood and affect normal.     BP 112/78   Pulse 67   Ht 6' (1.829 m)   Wt 148 lb (67.1 kg)   SpO2 98%   BMI 20.07 kg/m  Wt Readings from Last 3 Encounters:  01/05/20 148 lb (67.1 kg)  06/23/19 146 lb (66.2 kg)  01/13/19 149 lb (67.6 kg)     Health Maintenance Due  Topic Date Due  . HIV Screening  Never done  . COVID-19  Vaccine (3 - Pfizer risk 4-dose series) 11/16/2019    There are no preventive care reminders to display for this patient.  Lab Results  Component Value Date   TSH 2.49 11/24/2018   Lab Results  Component Value Date   WBC 4.5 11/24/2018   HGB 15.3 11/24/2018   HCT 45.3 11/24/2018   MCV 90.6 11/24/2018   PLT 194.0 11/24/2018   Lab Results  Component Value Date   NA 140 11/24/2018   K 4.6 11/24/2018   CO2 32 11/24/2018   GLUCOSE 93 11/24/2018   BUN 13 11/24/2018   CREATININE 0.95 11/24/2018   BILITOT 2.0 (H) 11/24/2018   ALKPHOS 64 11/24/2018   AST 18 11/24/2018   ALT 16 11/24/2018   PROT 6.8 11/24/2018   ALBUMIN 4.0 11/24/2018   CALCIUM 9.5 11/24/2018   ANIONGAP 7 08/19/2014   GFR 79.82 11/24/2018   Lab Results  Component Value Date   CHOL 194 11/24/2018   Lab Results  Component Value Date   HDL 63.70 11/24/2018   Lab Results  Component Value Date   LDLCALC 105 (H) 11/24/2018   Lab Results  Component Value Date   TRIG 124.0 11/24/2018   Lab Results  Component Value Date   CHOLHDL 3 11/24/2018   No results found for: HGBA1C    Assessment & Plan:   Problem List Items Addressed This Visit   None   Visit Diagnoses    Physical exam    -  Primary   Relevant Orders   Basic metabolic panel   CBC with Differential/Platelet   Hepatic function panel   Lipid panel   TSH    History of prostate cancer with past history of robotic prostatectomy.  Still followed by urology for PSAs.  We discussed the  following health maintenance issues:  -Recommend flu vaccine but patient declines -Discussed Prevnar 13 and Pneumovax and he declines but he will consider later this year -Discussed shingles vaccine and he will consider -Obtain lab work but not PSA as he gets this through urology -Wrote for some Bactroban ointment to use intranasal for flareups of his nasal irritation and nasal dryness  Meds ordered this encounter  Medications  . mupirocin ointment (BACTROBAN) 2 %    Sig: Apply 1 application topically 2 (two) times daily.    Dispense:  22 g    Refill:  2    Follow-up: No follow-ups on file.    Carolann Littler, MD

## 2020-01-05 NOTE — Patient Instructions (Signed)

## 2020-05-13 DIAGNOSIS — D225 Melanocytic nevi of trunk: Secondary | ICD-10-CM | POA: Diagnosis not present

## 2020-05-13 DIAGNOSIS — D485 Neoplasm of uncertain behavior of skin: Secondary | ICD-10-CM | POA: Diagnosis not present

## 2020-05-13 DIAGNOSIS — D2361 Other benign neoplasm of skin of right upper limb, including shoulder: Secondary | ICD-10-CM | POA: Diagnosis not present

## 2020-05-13 DIAGNOSIS — L814 Other melanin hyperpigmentation: Secondary | ICD-10-CM | POA: Diagnosis not present

## 2020-05-13 DIAGNOSIS — L821 Other seborrheic keratosis: Secondary | ICD-10-CM | POA: Diagnosis not present

## 2020-05-13 DIAGNOSIS — L918 Other hypertrophic disorders of the skin: Secondary | ICD-10-CM | POA: Diagnosis not present

## 2020-05-13 DIAGNOSIS — L820 Inflamed seborrheic keratosis: Secondary | ICD-10-CM | POA: Diagnosis not present

## 2020-06-02 DIAGNOSIS — C61 Malignant neoplasm of prostate: Secondary | ICD-10-CM | POA: Diagnosis not present

## 2020-06-07 DIAGNOSIS — N5201 Erectile dysfunction due to arterial insufficiency: Secondary | ICD-10-CM | POA: Diagnosis not present

## 2020-06-07 DIAGNOSIS — N3946 Mixed incontinence: Secondary | ICD-10-CM | POA: Diagnosis not present

## 2020-06-07 DIAGNOSIS — C61 Malignant neoplasm of prostate: Secondary | ICD-10-CM | POA: Diagnosis not present

## 2020-08-16 DIAGNOSIS — L905 Scar conditions and fibrosis of skin: Secondary | ICD-10-CM | POA: Diagnosis not present

## 2020-08-16 DIAGNOSIS — Z872 Personal history of diseases of the skin and subcutaneous tissue: Secondary | ICD-10-CM | POA: Diagnosis not present

## 2020-09-08 ENCOUNTER — Other Ambulatory Visit: Payer: Self-pay

## 2020-09-09 ENCOUNTER — Ambulatory Visit: Payer: BC Managed Care – PPO | Admitting: Family Medicine

## 2020-09-09 VITALS — BP 120/70 | HR 75 | Temp 97.8°F | Wt 150.4 lb

## 2020-09-09 DIAGNOSIS — R300 Dysuria: Secondary | ICD-10-CM

## 2020-09-09 LAB — POCT URINALYSIS DIPSTICK
Bilirubin, UA: POSITIVE
Blood, UA: NEGATIVE
Glucose, UA: NEGATIVE
Ketones, UA: NEGATIVE
Leukocytes, UA: NEGATIVE
Nitrite, UA: NEGATIVE
Protein, UA: POSITIVE — AB
Spec Grav, UA: 1.02 (ref 1.010–1.025)
Urobilinogen, UA: 0.2 E.U./dL
pH, UA: 7 (ref 5.0–8.0)

## 2020-09-09 NOTE — Patient Instructions (Signed)
Stay well hydrated- as you are doing  If symptoms recur, consider OTC Pyridium up to 2 every 6 hours as needed.

## 2020-09-09 NOTE — Progress Notes (Signed)
Established Patient Office Visit  Subjective:  Patient ID: Isaac Ramsey, male    DOB: 06/10/1955  Age: 65 y.o. MRN: [id number]  CC:  Chief Complaint  Patient presents with   Urinary Tract Infection    Urinary frequency, dysuria, hematuria, abdominal pressure    HPI Isaac Ramsey presents for 1 day history of urine frequency and a bit of mild suprapubic pressure.  He also noticed little bit of blood on the pad that he wears.  He has had previous prostatectomy and has some chronic urine incontinence and uses a clamp.  He frequently wipes secondary to leakage issues and does use baby powder frequently.  He wonders if he may have gotten some powder up in the urethra.  No discharge.  No fevers or chills.  He is monogamous.  No history of STD.  Symptoms are slightly improved this morning.  No recent flank pain.  No history of kidney stones.  Past Medical History:  Diagnosis Date   Chickenpox    Prostate cancer [facility])     Past Surgical History:  Procedure Laterality Date   LYMPHADENECTOMY Bilateral 08/18/2014   Procedure: BILATERAL PELVIC LYMPHADENECTOMY WITH RIGHT INGUINAL HERNIA REPAIR;  Surgeon: Alexis Frock, MD;  Location: [facility];  Service: Urology;  Laterality: Bilateral;   PROSTATE BIOPSY     3'16 -dx. prostate cancer   ROBOT ASSISTED LAPAROSCOPIC RADICAL PROSTATECTOMY N/A 08/18/2014   Procedure: ROBOTIC ASSISTED LAPAROSCOPIC RADICAL PROSTATECTOMY WITH INDOCYANINE GREEN DYE;  Surgeon: Alexis Frock, MD;  Location: [facility];  Service: Urology;  Laterality: N/A;   VASECTOMY      Family History  Problem Relation Age of Onset   Alcohol abuse Brother    Hypertension Mother    Arthritis Mother    Dementia Mother    Hyperlipidemia Father    Hypertension Father    Heart disease Father 31       CAD   Cancer Father        bladder   Alcohol abuse Brother    Esophageal cancer Neg Hx    Stomach cancer Neg Hx    Pancreatic cancer Neg Hx    Colon cancer Neg Hx    Rectal cancer  Neg Hx     Social History   Socioeconomic History   Marital status: Married    Spouse name: Not on file   Number of children: Not on file   Years of education: Not on file   Highest education level: Not on file  Occupational History   Not on file  Tobacco Use   Smoking status: Former    Types: Cigarettes    Quit date: 01/01/1982    Years since quitting: 38.7   Smokeless tobacco: Never  Vaping Use   Vaping Use: Never used  Substance and Sexual Activity   Alcohol use: No   Drug use: No   Sexual activity: Yes  Other Topics Concern   Not on file  Social History Narrative   Right handed   Social Determinants of Health   Financial Resource Strain: Not on file  Food Insecurity: Not on file  Transportation Needs: Not on file  Physical Activity: Not on file  Stress: Not on file  Social Connections: Not on file  Intimate Partner Violence: Not on file    Outpatient Medications Prior to Visit  Medication Sig Dispense Refill   mupirocin ointment (BACTROBAN) 2 % Apply 1 application topically 2 (two) times daily. 22 g 2   No facility-administered medications prior to  visit.    No Known Allergies  ROS Review of Systems  Constitutional:  Negative for chills and fever.  Genitourinary:  Positive for dysuria. Negative for flank pain and penile pain.     Objective:    Physical Exam Vitals reviewed.  Constitutional:      Appearance: Normal appearance.  Cardiovascular:     Rate and Rhythm: Normal rate and regular rhythm.  Pulmonary:     Effort: Pulmonary effort is normal.     Breath sounds: Normal breath sounds.  Neurological:     Mental Status: He is alert.    BP 120/70 (BP Location: Left Arm, Patient Position: Sitting, Cuff Size: Normal)   Pulse 75   Temp 97.8 F (36.6 C) (Oral)   Wt 150 lb 6.4 oz (68.2 kg)   SpO2 98%   BMI 20.40 kg/m  Wt Readings from Last 3 Encounters:  09/09/20 150 lb 6.4 oz (68.2 kg)  01/05/20 148 lb (67.1 kg)  06/23/19 146 lb (66.2 kg)      Health Maintenance Due  Topic Date Due   HIV Screening  Never done   Zoster Vaccines- Shingrix (1 of 2) Never done   COVID-19 Vaccine (3 - Pfizer risk series) 11/16/2019    There are no preventive care reminders to display for this patient.  Lab Results  Component Value Date   TSH 2.70 01/05/2020   Lab Results  Component Value Date   WBC 4.7 01/05/2020   HGB 14.4 01/05/2020   HCT 43.7 01/05/2020   MCV 90.0 01/05/2020   PLT 207.0 01/05/2020   Lab Results  Component Value Date   NA 138 01/05/2020   K 4.3 01/05/2020   CO2 33 (H) 01/05/2020   GLUCOSE 86 01/05/2020   BUN 17 01/05/2020   CREATININE 1.05 01/05/2020   BILITOT 1.7 (H) 01/05/2020   ALKPHOS 66 01/05/2020   AST 23 01/05/2020   ALT 27 01/05/2020   PROT 6.9 01/05/2020   ALBUMIN 4.4 01/05/2020   CALCIUM 9.3 01/05/2020   ANIONGAP 7 08/19/2014   GFR 74.70 01/05/2020   Lab Results  Component Value Date   CHOL 195 01/05/2020   Lab Results  Component Value Date   HDL 55.30 01/05/2020   Lab Results  Component Value Date   LDLCALC 110 (H) 01/05/2020   Lab Results  Component Value Date   TRIG 151.0 (H) 01/05/2020   Lab Results  Component Value Date   CHOLHDL 4 01/05/2020   No results found for: HGBA1C    Assessment & Plan:   Problem List Items Addressed This Visit   None Visit Diagnoses     Dysuria    -  Primary   Relevant Orders   POCT urinalysis dipstick (Completed)   Urine Culture     Urine dipstick does not reveal any evidence for infection.  Blood, leukocytes, and nitrites are all negative.  Suspect he probably had some local urethral irritation either from clamp or from baby powder.  -We obtain urine culture just to be totally safe but doubt infection -Continue to stay well-hydrated and consider trial of Pyridium for any recurrent symptoms -No indication for STD screening at this time.  No orders of the defined types were placed in this encounter.   Follow-up: No follow-ups on  file.    Carolann Littler, MD

## 2020-09-11 LAB — URINE CULTURE
MICRO NUMBER:: 12353441
SPECIMEN QUALITY:: ADEQUATE

## 2020-09-12 ENCOUNTER — Encounter: Payer: Self-pay | Admitting: Family Medicine

## 2020-09-12 ENCOUNTER — Other Ambulatory Visit: Payer: Self-pay

## 2020-09-12 MED ORDER — CEPHALEXIN 500 MG PO CAPS: 500.0000 mg | ORAL_CAPSULE | Freq: Three times a day (TID) | ORAL | 0 refills | Status: DC

## 2020-11-25 DIAGNOSIS — J101 Influenza due to other identified influenza virus with other respiratory manifestations: Secondary | ICD-10-CM | POA: Diagnosis not present

## 2020-11-25 DIAGNOSIS — Z9189 Other specified personal risk factors, not elsewhere classified: Secondary | ICD-10-CM | POA: Diagnosis not present

## 2020-11-25 DIAGNOSIS — Z1152 Encounter for screening for COVID-19: Secondary | ICD-10-CM | POA: Diagnosis not present

## 2021-01-11 ENCOUNTER — Ambulatory Visit (INDEPENDENT_AMBULATORY_CARE_PROVIDER_SITE_OTHER): Payer: BC Managed Care – PPO | Admitting: Family Medicine

## 2021-01-11 VITALS — BP 120/62 | HR 75 | Temp 97.8°F | Ht 72.0 in | Wt 148.6 lb

## 2021-01-11 DIAGNOSIS — Z0000 Encounter for general adult medical examination without abnormal findings: Secondary | ICD-10-CM | POA: Diagnosis not present

## 2021-01-11 LAB — BASIC METABOLIC PANEL
BUN: 16 mg/dL (ref 6–23)
CO2: 31 mEq/L (ref 19–32)
Calcium: 9.3 mg/dL (ref 8.4–10.5)
Chloride: 101 mEq/L (ref 96–112)
Creatinine, Ser: 1 mg/dL (ref 0.40–1.50)
GFR: 78.64 mL/min (ref >60.00)
Glucose, Bld: 88 mg/dL (ref 70–99)
Potassium: 4.2 mEq/L (ref 3.5–5.1)
Sodium: 138 mEq/L (ref 135–145)

## 2021-01-11 LAB — HEPATIC FUNCTION PANEL
ALT: 23 U/L (ref 0–53)
AST: 20 U/L (ref 0–37)
Albumin: 4.2 g/dL (ref 3.5–5.2)
Alkaline Phosphatase: 61 U/L (ref 39–117)
Bilirubin, Direct: 0.2 mg/dL (ref 0.0–0.3)
Total Bilirubin: 1.7 mg/dL — ABNORMAL HIGH (ref 0.2–1.2)
Total Protein: 7.2 g/dL (ref 6.0–8.3)

## 2021-01-11 LAB — LIPID PANEL
Cholesterol: 213 mg/dL — ABNORMAL HIGH (ref 0–200)
HDL: 64.1 mg/dL (ref >39.00)
LDL Cholesterol: 121 mg/dL — ABNORMAL HIGH (ref 0–99)
NonHDL: 148.76
Total CHOL/HDL Ratio: 3
Triglycerides: 137 mg/dL (ref 0.0–149.0)
VLDL: 27.4 mg/dL (ref 0.0–40.0)

## 2021-01-11 LAB — CBC WITH DIFFERENTIAL/PLATELET
Basophils Absolute: 0 10*3/uL (ref 0.0–0.1)
Basophils Relative: 0.6 % (ref 0.0–3.0)
Eosinophils Absolute: 0.2 10*3/uL (ref 0.0–0.7)
Eosinophils Relative: 4.6 % (ref 0.0–5.0)
HCT: 45.1 % (ref 39.0–52.0)
Hemoglobin: 15 g/dL (ref 13.0–17.0)
Lymphocytes Relative: 25.4 % (ref 12.0–46.0)
Lymphs Abs: 1.3 10*3/uL (ref 0.7–4.0)
MCHC: 33.2 g/dL (ref 30.0–36.0)
MCV: 89.8 fl (ref 78.0–100.0)
Monocytes Absolute: 0.4 10*3/uL (ref 0.1–1.0)
Monocytes Relative: 7.5 % (ref 3.0–12.0)
Neutro Abs: 3.1 10*3/uL (ref 1.4–7.7)
Neutrophils Relative %: 61.9 % (ref 43.0–77.0)
Platelets: 207 10*3/uL (ref 150.0–400.0)
RBC: 5.02 Mil/uL (ref 4.22–5.81)
RDW: 13.3 % (ref 11.5–15.5)
WBC: 5 10*3/uL (ref 4.0–10.5)

## 2021-01-11 LAB — TSH: TSH: 2.84 u[IU]/mL (ref 0.35–5.50)

## 2021-01-11 NOTE — Patient Instructions (Signed)
Let us know if you change your mind regarding Shingrix or Prevanar (pneumonia) vaccines.  Try to establish more consistent exercise.

## 2021-01-11 NOTE — Progress Notes (Signed)
Established Patient Office Visit  Subjective:  Patient ID: Isaac Ramsey, male    DOB: 1955/09/03  Age: 66 y.o. MRN: [id number]  CC:  Chief Complaint  Patient presents with   Annual Exam    HPI Isaac Ramsey presents for annual physical exam.  He had a couple weeks off over the holidays from work and is just selling back into work routine.  He has history of prostate cancer and has had previous laparoscopic radical prostatectomy 2016.  His urologist checks his PSA yearly.  He is currently taking no regular medications.  He and his wife are contemplating starting more consistent exercise soon.  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  He is not interested in medical therapy currently.  He has essential tremor and currently not taking anything for that.  Previously treated with primidone.  He has considered going back on this but is not sure at this point.  Health maintenance reviewed.  He is due for repeat colonoscopy next year.  He declines Prevnar 20 and Shingrix.  Declines flu vaccine.  Previous hepatitis C screen negative.  Tetanus due 2025.  Social history and family history reviewed and as per previous note of 01-05-2020 with no significant changes.  Past Medical History:  Diagnosis Date   Chickenpox    Prostate cancer [facility])     Past Surgical History:  Procedure Laterality Date   LYMPHADENECTOMY Bilateral 08/18/2014   Procedure: BILATERAL PELVIC LYMPHADENECTOMY WITH RIGHT INGUINAL HERNIA REPAIR;  Surgeon: Alexis Frock, MD;  Location: [facility];  Service: Urology;  Laterality: Bilateral;   PROSTATE BIOPSY     3'16 -dx. prostate cancer   ROBOT ASSISTED LAPAROSCOPIC RADICAL PROSTATECTOMY N/A 08/18/2014   Procedure: ROBOTIC ASSISTED LAPAROSCOPIC RADICAL PROSTATECTOMY WITH INDOCYANINE GREEN DYE;  Surgeon: Alexis Frock, MD;  Location: [facility];  Service: Urology;  Laterality: N/A;   VASECTOMY      Family History  Problem Relation Age of Onset   Alcohol  abuse Brother    Hypertension Mother    Arthritis Mother    Dementia Mother    Hyperlipidemia Father    Hypertension Father    Heart disease Father 27       CAD   Cancer Father        bladder   Alcohol abuse Brother    Esophageal cancer Neg Hx    Stomach cancer Neg Hx    Pancreatic cancer Neg Hx    Colon cancer Neg Hx    Rectal cancer Neg Hx     Social History   Socioeconomic History   Marital status: Married    Spouse name: Not on file   Number of children: Not on file   Years of education: Not on file   Highest education level: Not on file  Occupational History   Not on file  Tobacco Use   Smoking status: Former    Types: Cigarettes    Quit date: 01/01/1982    Years since quitting: 39.0   Smokeless tobacco: Never  Vaping Use   Vaping Use: Never used  Substance and Sexual Activity   Alcohol use: No   Drug use: No   Sexual activity: Yes  Other Topics Concern   Not on file  Social History Narrative   Right handed   Social Determinants of Health   Financial Resource Strain: Not on file  Food Insecurity: Not on file  Transportation Needs: Not on file  Physical Activity: Not on file  Stress: Not on file  Social Connections: Not on file  Intimate Partner Violence: Not on file    Outpatient Medications Prior to Visit  Medication Sig Dispense Refill   mupirocin ointment (BACTROBAN) 2 % Apply 1 application topically 2 (two) times daily. 22 g 2   cephALEXin (KEFLEX) 500 MG capsule Take 1 capsule (500 mg total) by mouth 3 (three) times daily. 28 capsule 0   No facility-administered medications prior to visit.    No Known Allergies  ROS Review of Systems  Constitutional:  Negative for activity change, appetite change, fatigue and fever.  HENT:  Negative for congestion, ear pain and trouble swallowing.   Eyes:  Negative for pain and visual disturbance.  Respiratory:  Negative for cough, shortness of breath and wheezing.   Cardiovascular:  Negative for chest  pain and palpitations.  Gastrointestinal:  Negative for abdominal distention, abdominal pain, blood in stool, constipation, diarrhea, nausea, rectal pain and vomiting.  Genitourinary:  Negative for dysuria, hematuria and testicular pain.  Musculoskeletal:  Negative for arthralgias and joint swelling.  Skin:  Negative for rash.  Neurological:  Positive for tremors. Negative for dizziness, syncope and headaches.  Hematological:  Negative for adenopathy.  Psychiatric/Behavioral:  Negative for confusion and dysphoric mood.      Objective:    Physical Exam Constitutional:      General: He is not in acute distress.    Appearance: He is well-developed.  HENT:     Head: Normocephalic and atraumatic.     Right Ear: External ear normal.     Left Ear: External ear normal.  Eyes:     Conjunctiva/sclera: Conjunctivae normal.     Pupils: Pupils are equal, round, and reactive to light.  Neck:     Thyroid: No thyromegaly.  Cardiovascular:     Rate and Rhythm: Normal rate and regular rhythm.     Heart sounds: Normal heart sounds. No murmur heard. Pulmonary:     Effort: No respiratory distress.     Breath sounds: No wheezing or rales.  Abdominal:     General: Bowel sounds are normal. There is no distension.     Palpations: Abdomen is soft. There is no mass.     Tenderness: There is no abdominal tenderness. There is no guarding or rebound.  Musculoskeletal:     Cervical back: Normal range of motion and neck supple.     Right lower leg: No edema.     Left lower leg: No edema.  Lymphadenopathy:     Cervical: No cervical adenopathy.  Skin:    Findings: No rash.  Neurological:     Mental Status: He is alert and oriented to person, place, and time.     Cranial Nerves: No cranial nerve deficit.     Comments: He does have upper extremity tremor bilaterally consistent with his essential tremor    BP 120/62 (BP Location: Left Arm, Patient Position: Sitting, Cuff Size: Normal)    Pulse 75    Temp  97.8 F (36.6 C) (Oral)    Ht 6' (1.829 m)    Wt 148 lb 9.6 oz (67.4 kg)    SpO2 99%    BMI 20.15 kg/m  Wt Readings from Last 3 Encounters:  01/11/21 148 lb 9.6 oz (67.4 kg)  09/09/20 150 lb 6.4 oz (68.2 kg)  01/05/20 148 lb (67.1 kg)     Health Maintenance Due  Topic Date Due   COVID-19 Vaccine (3 - Pfizer risk series) 11/16/2019  There are no preventive care reminders to display for this patient.  Lab Results  Component Value Date   TSH 2.70 01/05/2020   Lab Results  Component Value Date   WBC 4.7 01/05/2020   HGB 14.4 01/05/2020   HCT 43.7 01/05/2020   MCV 90.0 01/05/2020   PLT 207.0 01/05/2020   Lab Results  Component Value Date   NA 138 01/05/2020   K 4.3 01/05/2020   CO2 33 (H) 01/05/2020   GLUCOSE 86 01/05/2020   BUN 17 01/05/2020   CREATININE 1.05 01/05/2020   BILITOT 1.7 (H) 01/05/2020   ALKPHOS 66 01/05/2020   AST 23 01/05/2020   ALT 27 01/05/2020   PROT 6.9 01/05/2020   ALBUMIN 4.4 01/05/2020   CALCIUM 9.3 01/05/2020   ANIONGAP 7 08/19/2014   GFR 74.70 01/05/2020   Lab Results  Component Value Date   CHOL 195 01/05/2020   Lab Results  Component Value Date   HDL 55.30 01/05/2020   Lab Results  Component Value Date   LDLCALC 110 (H) 01/05/2020   Lab Results  Component Value Date   TRIG 151.0 (H) 01/05/2020   Lab Results  Component Value Date   CHOLHDL 4 01/05/2020   No results found for: HGBA1C    Assessment & Plan:   Problem List Items Addressed This Visit   None Visit Diagnoses     Physical exam    -  Primary   Relevant Orders   Basic metabolic panel   Lipid panel   CBC with Differential/Platelet   TSH   Hepatic function panel      We discussed preventative issues including Shingrix and Prevnar 20 but he declines both.  He declines flu vaccine.  Obtain screening labs.  Would not get PSA since he sees urologist for this.  We discussed possible treatment of his cold sore with Valtrex but he declines at this  time.  Establish more consistent exercise habits.  Needs repeat colonoscopy next year  No orders of the defined types were placed in this encounter.   Follow-up: No follow-ups on file.    Carolann Littler, MD

## 2021-04-13 ENCOUNTER — Ambulatory Visit (INDEPENDENT_AMBULATORY_CARE_PROVIDER_SITE_OTHER): Payer: BC Managed Care – PPO

## 2021-04-13 ENCOUNTER — Ambulatory Visit: Payer: BC Managed Care – PPO | Admitting: Podiatry

## 2021-04-13 DIAGNOSIS — M722 Plantar fascial fibromatosis: Secondary | ICD-10-CM

## 2021-04-13 MED ORDER — MELOXICAM 15 MG PO TABS: 15.0000 mg | ORAL_TABLET | Freq: Every day | ORAL | 3 refills | Status: DC

## 2021-04-13 NOTE — Patient Instructions (Signed)

## 2021-04-16 NOTE — Progress Notes (Signed)
?  Subjective:  ?Patient ID: Isaac Ramsey, male    DOB: February 02, 1955,  MRN: [id number] ?HPI ?Chief Complaint  ?Patient presents with  ? Foot Pain  ?  Plantar heel left - aching x few months, AM pain, feels like a knot when walking, no treatment  ? New Patient (Initial Visit)  ? ? ?66 y.o. male presents with the above complaint.  ? ?ROS: Denies fever chills nausea vomiting muscle aches pains calf pain back pain chest pain shortness of breath. ? ?Past Medical History:  ?Diagnosis Date  ? Chickenpox   ? Prostate cancer ([place])   ? ?Past Surgical History:  ?Procedure Laterality Date  ? LYMPHADENECTOMY Bilateral 08/18/2014  ? Procedure: BILATERAL PELVIC LYMPHADENECTOMY WITH RIGHT INGUINAL HERNIA REPAIR;  Surgeon: Alexis Frock, MD;  Location: [facility];  Service: Urology;  Laterality: Bilateral;  ? PROSTATE BIOPSY    ? 3'16 -dx. prostate cancer  ? ROBOT ASSISTED LAPAROSCOPIC RADICAL PROSTATECTOMY N/A 08/18/2014  ? Procedure: ROBOTIC ASSISTED LAPAROSCOPIC RADICAL PROSTATECTOMY WITH INDOCYANINE GREEN DYE;  Surgeon: Alexis Frock, MD;  Location: [facility];  Service: Urology;  Laterality: N/A;  ? VASECTOMY    ? ? ?Current Outpatient Medications:  ?  meloxicam (MOBIC) 15 MG tablet, Take 1 tablet (15 mg total) by mouth daily., Disp: 30 tablet, Rfl: 3 ? ?No Known Allergies ?Review of Systems ?Objective:  ?There were no vitals filed for this visit. ? ?General: Well developed, nourished, in no acute distress, alert and oriented x3  ? ?Dermatological: Skin is warm, dry and supple bilateral. Nails x 10 are well maintained; remaining integument appears unremarkable at this time. There are no open sores, no preulcerative lesions, no rash or signs of infection present. ? ?Vascular: Dorsalis Pedis artery and Posterior Tibial artery pedal pulses are 2/4 bilateral with immedate capillary fill time. Pedal hair growth present. No varicosities and no lower extremity edema present bilateral.  ? ?Neruologic: Grossly intact via light touch bilateral.  Vibratory intact via tuning fork bilateral. Protective threshold with Semmes Wienstein monofilament intact to all pedal sites bilateral. Patellar and Achilles deep tendon reflexes 2+ bilateral. No Babinski or clonus noted bilateral.  ? ?Musculoskeletal: No gross boney pedal deformities bilateral. No pain, crepitus, or limitation noted with foot and ankle range of motion bilateral. Muscular strength 5/5 in all groups tested bilateral.  Very mild tenderness on palpation medial calcaneal tubercle of the left heel. ? ?Gait: Unassisted, Nonantalgic.  ? ? ?Radiographs: ? ?Radiographs taken today demonstrate osseously mature individual no significant osseous abnormalities in the area of question soft tissue increase in density is very minimal at the plantar fascial Caney insertion site. ? ?Assessment & Plan:  ? ?Assessment: Planter fasciitis minimal in nature.   ? ?Plan: Start him on meloxicam.  Discussed appropriate shoe gear stretching exercises ice therapy and shoe gear modifications.  I will follow-up with him on an as-needed basis.  Should this worsen he is to notify us immediately. ? ? ? ? ?Melessa Cowell T. Huntington, DPM ?

## 2021-05-16 DIAGNOSIS — L821 Other seborrheic keratosis: Secondary | ICD-10-CM | POA: Diagnosis not present

## 2021-05-16 DIAGNOSIS — L578 Other skin changes due to chronic exposure to nonionizing radiation: Secondary | ICD-10-CM | POA: Diagnosis not present

## 2021-05-16 DIAGNOSIS — D225 Melanocytic nevi of trunk: Secondary | ICD-10-CM | POA: Diagnosis not present

## 2021-05-16 DIAGNOSIS — L814 Other melanin hyperpigmentation: Secondary | ICD-10-CM | POA: Diagnosis not present

## 2021-06-29 DIAGNOSIS — C61 Malignant neoplasm of prostate: Secondary | ICD-10-CM | POA: Diagnosis not present

## 2021-07-06 DIAGNOSIS — N3946 Mixed incontinence: Secondary | ICD-10-CM | POA: Diagnosis not present

## 2021-07-06 DIAGNOSIS — N5201 Erectile dysfunction due to arterial insufficiency: Secondary | ICD-10-CM | POA: Diagnosis not present

## 2021-07-06 DIAGNOSIS — C61 Malignant neoplasm of prostate: Secondary | ICD-10-CM | POA: Diagnosis not present

## 2021-08-08 ENCOUNTER — Other Ambulatory Visit: Payer: Self-pay | Admitting: Podiatry

## 2022-01-16 ENCOUNTER — Encounter: Payer: Self-pay | Admitting: Family Medicine

## 2022-01-16 ENCOUNTER — Ambulatory Visit (INDEPENDENT_AMBULATORY_CARE_PROVIDER_SITE_OTHER): Payer: BC Managed Care – PPO | Admitting: Family Medicine

## 2022-01-16 VITALS — BP 110/70 | HR 82 | Temp 98.5°F | Ht 72.05 in | Wt 145.5 lb

## 2022-01-16 DIAGNOSIS — Z0000 Encounter for general adult medical examination without abnormal findings: Secondary | ICD-10-CM

## 2022-01-16 LAB — LIPID PANEL
Cholesterol: 190 mg/dL (ref 0–200)
HDL: 63 mg/dL (ref >39.00)
LDL Cholesterol: 100 mg/dL — ABNORMAL HIGH (ref 0–99)
NonHDL: 127.36
Total CHOL/HDL Ratio: 3
Triglycerides: 139 mg/dL (ref 0.0–149.0)
VLDL: 27.8 mg/dL (ref 0.0–40.0)

## 2022-01-16 LAB — CBC WITH DIFFERENTIAL/PLATELET
Basophils Absolute: 0 10*3/uL (ref 0.0–0.1)
Basophils Relative: 0.8 % (ref 0.0–3.0)
Eosinophils Absolute: 0.3 10*3/uL (ref 0.0–0.7)
Eosinophils Relative: 6.4 % — ABNORMAL HIGH (ref 0.0–5.0)
HCT: 45 % (ref 39.0–52.0)
Hemoglobin: 15.1 g/dL (ref 13.0–17.0)
Lymphocytes Relative: 26.8 % (ref 12.0–46.0)
Lymphs Abs: 1.4 10*3/uL (ref 0.7–4.0)
MCHC: 33.6 g/dL (ref 30.0–36.0)
MCV: 90.3 fl (ref 78.0–100.0)
Monocytes Absolute: 0.4 10*3/uL (ref 0.1–1.0)
Monocytes Relative: 7.8 % (ref 3.0–12.0)
Neutro Abs: 3.1 10*3/uL (ref 1.4–7.7)
Neutrophils Relative %: 58.2 % (ref 43.0–77.0)
Platelets: 211 10*3/uL (ref 150.0–400.0)
RBC: 4.99 Mil/uL (ref 4.22–5.81)
RDW: 13.2 % (ref 11.5–15.5)
WBC: 5.3 10*3/uL (ref 4.0–10.5)

## 2022-01-16 LAB — HEPATIC FUNCTION PANEL
ALT: 24 U/L (ref 0–53)
AST: 23 U/L (ref 0–37)
Albumin: 4.2 g/dL (ref 3.5–5.2)
Alkaline Phosphatase: 62 U/L (ref 39–117)
Bilirubin, Direct: 0.3 mg/dL (ref 0.0–0.3)
Total Bilirubin: 1.9 mg/dL — ABNORMAL HIGH (ref 0.2–1.2)
Total Protein: 6.9 g/dL (ref 6.0–8.3)

## 2022-01-16 LAB — BASIC METABOLIC PANEL
BUN: 11 mg/dL (ref 6–23)
CO2: 32 mEq/L (ref 19–32)
Calcium: 9.2 mg/dL (ref 8.4–10.5)
Chloride: 100 mEq/L (ref 96–112)
Creatinine, Ser: 1.04 mg/dL (ref 0.40–1.50)
GFR: 74.49 mL/min (ref >60.00)
Glucose, Bld: 100 mg/dL — ABNORMAL HIGH (ref 70–99)
Potassium: 4.2 mEq/L (ref 3.5–5.1)
Sodium: 139 mEq/L (ref 135–145)

## 2022-01-16 LAB — HEMOGLOBIN A1C: Hgb A1c MFr Bld: 5.6 % (ref 4.6–6.5)

## 2022-01-16 NOTE — Progress Notes (Signed)
Established Patient Office Visit  Subjective   Patient ID: Isaac Ramsey, male    DOB: 1955/01/21  Age: 67 y.o. MRN: [id number]  Chief Complaint  Patient presents with   Annual Exam    HPI   Isaac Ramsey is seen for annual physical exam.  Generally fairly healthy.  He does have history of prostate cancer with prior radiation therapy and does have some chronic urine incontinence related to that.  Is looking at possible sling procedure with urology soon.  He gets PSAs monitored yearly through urology.  He does have history of essential tremor but does not take any medications for that currently.  Generally doing well.  His only complaint really today is clearing of throat frequently and sometimes feels like small foods such as rice did not go down well.  No solid food dysphagia otherwise.  Appetite and weight stable.  No pain with swallowing.  He does have history of colon polyps and is due for repeat colonoscopy this year although he thinks he may postpone this for 1 year  Health maintenance reviewed:  -Declines flu vaccine, Prevnar 20, and Shingrix -Prior hepatitis C screen negative -Colonoscopy due this year although he declines  Family history significant for father having bladder cancer.  Both parents had hypertension.  Mother with history of dementia.  He has 2 brothers that have had alcohol problems.  Social history-he is married and this is his second marriage.  1 daughter and 2 sons.  Works as an Programmer, [employer].  No regular alcohol.  Quit smoking 1984.  Past Medical History:  Diagnosis Date   Chickenpox    Prostate cancer [facility])    Past Surgical History:  Procedure Laterality Date   LYMPHADENECTOMY Bilateral 08/18/2014   Procedure: BILATERAL PELVIC LYMPHADENECTOMY WITH RIGHT INGUINAL HERNIA REPAIR;  Surgeon: Alexis Frock, MD;  [facility];  Service: Urology;  Laterality: Bilateral;   PROSTATE BIOPSY     3'16 -dx. prostate cancer   ROBOT ASSISTED LAPAROSCOPIC  RADICAL PROSTATECTOMY N/A 08/18/2014   Procedure: ROBOTIC ASSISTED LAPAROSCOPIC RADICAL PROSTATECTOMY WITH INDOCYANINE GREEN DYE;  Surgeon: Alexis Frock, MD;  Location: [facility];  Service: Urology;  Laterality: N/A;   VASECTOMY      reports that he quit smoking about 40 years ago. His smoking use included cigarettes. He has never used smokeless tobacco. He reports that he does not drink alcohol and does not use drugs. family history includes Alcohol abuse in his brother and brother; Arthritis in his mother; Cancer in his father; Dementia in his mother; Heart disease (age of onset: 15) in his father; Hyperlipidemia in his father; Hypertension in his father and mother. No Known Allergies   Review of Systems  Constitutional:  Negative for chills, fever, malaise/fatigue and weight loss.  HENT:  Negative for hearing loss.   Eyes:  Negative for blurred vision and double vision.  Respiratory:  Negative for cough and shortness of breath.   Cardiovascular:  Negative for chest pain, palpitations and leg swelling.  Gastrointestinal:  Negative for abdominal pain, blood in stool, constipation and diarrhea.  Genitourinary:  Negative for dysuria.  Skin:  Negative for rash.  Neurological:  Negative for dizziness, speech change, seizures, loss of consciousness and headaches.  Psychiatric/Behavioral:  Negative for depression.       Objective:     BP 110/70 (BP Location: Left Arm, Patient Position: Sitting, Cuff Size: Normal)   Pulse 82   Temp 98.5 F (36.9 C) (Oral)   Ht 6' 0.05" (  1.83 m)   Wt 145 lb 8 oz (66 kg)   SpO2 99%   BMI 19.71 kg/m    Physical Exam Vitals reviewed.  Constitutional:      General: He is not in acute distress.    Appearance: He is well-developed.  HENT:     Head: Normocephalic and atraumatic.     Right Ear: External ear normal.     Left Ear: External ear normal.  Eyes:     Conjunctiva/sclera: Conjunctivae normal.     Pupils: Pupils are equal, round, and reactive to  light.  Neck:     Thyroid: No thyromegaly.  Cardiovascular:     Rate and Rhythm: Normal rate and regular rhythm.     Heart sounds: Normal heart sounds. No murmur heard. Pulmonary:     Effort: No respiratory distress.     Breath sounds: No wheezing or rales.  Abdominal:     General: Bowel sounds are normal. There is no distension.     Palpations: Abdomen is soft. There is no mass.     Tenderness: There is no abdominal tenderness. There is no guarding or rebound.  Musculoskeletal:     Cervical back: Normal range of motion and neck supple.     Right lower leg: No edema.     Left lower leg: No edema.  Lymphadenopathy:     Cervical: No cervical adenopathy.  Skin:    Findings: No rash.  Neurological:     Mental Status: He is alert and oriented to person, place, and time.     Cranial Nerves: No cranial nerve deficit.      No results found for any visits on 01/16/22.    The 10-year ASCVD risk score (Arnett DK, et al., 2019) is: 10.5%    Assessment & Plan:   Problem List Items Addressed This Visit   None Visit Diagnoses     Physical exam    -  Primary   Relevant Orders   Basic metabolic panel   Lipid panel   CBC with Differential/Platelet   Hepatic function panel   Hemoglobin A1c     Ransom has history of prostate cancer and is followed regularly by urology for that.  He had some recent frequent clearing of throat with sensation of mucus in esophagus.  We explained this is frequently related to silent reflux.  Consider trial of over-the-counter PPI Nexium or Prilosec 20 mg daily and be in touch if symptoms not clearing with that  -We did recommend he consider flu vaccine, Shingrix, and Prevnar 20 but he declines -Due for repeat colonoscopy this year but he declines at this time -He does request A1c.  No history of hyperglycemia previously.  He is aware insurance may not cover for this with diagnosis of physical  No follow-ups on file.    Carolann Littler, MD

## 2022-01-16 NOTE — Patient Instructions (Signed)
Consider repeat Colonoscopy this year.

## 2022-02-19 DIAGNOSIS — H0288B Meibomian gland dysfunction left eye, upper and lower eyelids: Secondary | ICD-10-CM | POA: Diagnosis not present

## 2022-02-19 DIAGNOSIS — H2513 Age-related nuclear cataract, bilateral: Secondary | ICD-10-CM | POA: Diagnosis not present

## 2022-02-19 DIAGNOSIS — H52223 Regular astigmatism, bilateral: Secondary | ICD-10-CM | POA: Diagnosis not present

## 2022-02-19 DIAGNOSIS — H43813 Vitreous degeneration, bilateral: Secondary | ICD-10-CM | POA: Diagnosis not present

## 2022-02-19 DIAGNOSIS — H0288A Meibomian gland dysfunction right eye, upper and lower eyelids: Secondary | ICD-10-CM | POA: Diagnosis not present

## 2022-02-19 DIAGNOSIS — H524 Presbyopia: Secondary | ICD-10-CM | POA: Diagnosis not present

## 2022-03-02 DIAGNOSIS — N3946 Mixed incontinence: Secondary | ICD-10-CM | POA: Diagnosis not present

## 2022-03-02 DIAGNOSIS — R350 Frequency of micturition: Secondary | ICD-10-CM | POA: Diagnosis not present

## 2022-03-02 DIAGNOSIS — R351 Nocturia: Secondary | ICD-10-CM | POA: Diagnosis not present

## 2022-04-11 DIAGNOSIS — N3946 Mixed incontinence: Secondary | ICD-10-CM | POA: Diagnosis not present

## 2022-04-11 DIAGNOSIS — R350 Frequency of micturition: Secondary | ICD-10-CM | POA: Diagnosis not present

## 2022-05-17 DIAGNOSIS — D2372 Other benign neoplasm of skin of left lower limb, including hip: Secondary | ICD-10-CM | POA: Diagnosis not present

## 2022-05-17 DIAGNOSIS — L814 Other melanin hyperpigmentation: Secondary | ICD-10-CM | POA: Diagnosis not present

## 2022-05-17 DIAGNOSIS — D485 Neoplasm of uncertain behavior of skin: Secondary | ICD-10-CM | POA: Diagnosis not present

## 2022-05-17 DIAGNOSIS — D692 Other nonthrombocytopenic purpura: Secondary | ICD-10-CM | POA: Diagnosis not present

## 2022-05-17 DIAGNOSIS — L821 Other seborrheic keratosis: Secondary | ICD-10-CM | POA: Diagnosis not present

## 2023-01-29 ENCOUNTER — Ambulatory Visit (INDEPENDENT_AMBULATORY_CARE_PROVIDER_SITE_OTHER): Payer: BC Managed Care – PPO | Admitting: Family Medicine

## 2023-01-29 ENCOUNTER — Encounter: Payer: Self-pay | Admitting: Family Medicine

## 2023-01-29 VITALS — BP 120/72 | HR 77 | Temp 97.6°F | Ht 72.05 in | Wt 151.2 lb

## 2023-01-29 DIAGNOSIS — Z0000 Encounter for general adult medical examination without abnormal findings: Secondary | ICD-10-CM

## 2023-01-29 DIAGNOSIS — Z23 Encounter for immunization: Secondary | ICD-10-CM

## 2023-01-29 LAB — CBC WITH DIFFERENTIAL/PLATELET
Basophils Absolute: 0 10*3/uL (ref 0.0–0.1)
Basophils Relative: 0.7 % (ref 0.0–3.0)
Eosinophils Absolute: 0.3 10*3/uL (ref 0.0–0.7)
Eosinophils Relative: 5.7 % — ABNORMAL HIGH (ref 0.0–5.0)
HCT: 45.2 % (ref 39.0–52.0)
Hemoglobin: 15.1 g/dL (ref 13.0–17.0)
Lymphocytes Relative: 26.8 % (ref 12.0–46.0)
Lymphs Abs: 1.3 10*3/uL (ref 0.7–4.0)
MCHC: 33.5 g/dL (ref 30.0–36.0)
MCV: 91.5 fL (ref 78.0–100.0)
Monocytes Absolute: 0.4 10*3/uL (ref 0.1–1.0)
Monocytes Relative: 7.7 % (ref 3.0–12.0)
Neutro Abs: 2.9 10*3/uL (ref 1.4–7.7)
Neutrophils Relative %: 59.1 % (ref 43.0–77.0)
Platelets: 196 10*3/uL (ref 150.0–400.0)
RBC: 4.94 Mil/uL (ref 4.22–5.81)
RDW: 13.2 % (ref 11.5–15.5)
WBC: 4.9 10*3/uL (ref 4.0–10.5)

## 2023-01-29 LAB — COMPREHENSIVE METABOLIC PANEL
ALT: 20 U/L (ref 0–53)
AST: 24 U/L (ref 0–37)
Albumin: 4.2 g/dL (ref 3.5–5.2)
Alkaline Phosphatase: 67 U/L (ref 39–117)
BUN: 10 mg/dL (ref 6–23)
CO2: 32 meq/L (ref 19–32)
Calcium: 9.2 mg/dL (ref 8.4–10.5)
Chloride: 100 meq/L (ref 96–112)
Creatinine, Ser: 1.02 mg/dL (ref 0.40–1.50)
GFR: 75.7 mL/min (ref >60.00)
Glucose, Bld: 100 mg/dL — ABNORMAL HIGH (ref 70–99)
Potassium: 4.4 meq/L (ref 3.5–5.1)
Sodium: 138 meq/L (ref 135–145)
Total Bilirubin: 1.4 mg/dL — ABNORMAL HIGH (ref 0.2–1.2)
Total Protein: 6.9 g/dL (ref 6.0–8.3)

## 2023-01-29 LAB — LIPID PANEL
Cholesterol: 218 mg/dL — ABNORMAL HIGH (ref 0–200)
HDL: 65.2 mg/dL (ref >39.00)
LDL Cholesterol: 119 mg/dL — ABNORMAL HIGH (ref 0–99)
NonHDL: 153.08
Total CHOL/HDL Ratio: 3
Triglycerides: 172 mg/dL — ABNORMAL HIGH (ref 0.0–149.0)
VLDL: 34.4 mg/dL (ref 0.0–40.0)

## 2023-01-29 LAB — HEMOGLOBIN A1C: Hgb A1c MFr Bld: 5.7 % (ref 4.6–6.5)

## 2023-01-29 NOTE — Progress Notes (Signed)
Established Patient Office Visit  Subjective   Patient ID: Isaac Ramsey, male    DOB: [birth year]  Age: 68 y.o. MRN: [id number]  Chief Complaint  Patient presents with   Annual Exam    HPI   Baldemar is here today for physical exam.  He has history of prostate cancer and is still followed by urology but will likely be released this year.  They check his PSA yearly and levels have been near 0.  Isham takes [place] and does have some urine incontinence related to previous cancer treatment but takes no other medications.  Generally doing well.  Still works for [employer].  Is anticipating working about 2 more years and then retiring.  Health maintenance reviewed  Health Maintenance  Topic Date Due   COVID-19 Vaccine (3 - Pfizer risk series) 02/14/2023 (Originally 11/16/2019)   Colonoscopy  01/29/2024 (Originally 01/12/2022)   DTaP/Tdap/Td (3 - Td or Tdap) 09/10/2023   Hepatitis C Screening  Completed   HPV VACCINES  Aged Out   Pneumonia Vaccine 66+ Years old  Discontinued   INFLUENZA VACCINE  Discontinued   Zoster Vaccines- Shingrix  Discontinued   -He does consent to tetanus booster but declines pneumonia vaccine and Shingrix  Family history-father had bladder cancer.  He also had multiple other problems including type 2 diabetes, alcohol abuse, hyperlipidemia.  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  Both parents had hypertension.  Mom had dementia.  He has 4 brothers.  1 died of alcohol-related problems.  None have had CAD.  Social history-he is married.  This is his second marriage.  He has a daughter and 2 sons from prior marriage.  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  No alcohol use.  Quit smoking 1984.  Past Medical History:  Diagnosis Date   Chickenpox    Prostate cancer [facility])    Past Surgical History:  Procedure Laterality Date   LYMPHADENECTOMY Bilateral 08/18/2014   Procedure: BILATERAL PELVIC LYMPHADENECTOMY WITH RIGHT INGUINAL HERNIA REPAIR;   Surgeon: Sebastian Ache, MD;  Location: [facility];  Service: Urology;  Laterality: Bilateral;   PROSTATE BIOPSY     3'16 -dx. prostate cancer   ROBOT ASSISTED LAPAROSCOPIC RADICAL PROSTATECTOMY N/A 08/18/2014   Procedure: ROBOTIC ASSISTED LAPAROSCOPIC RADICAL PROSTATECTOMY WITH INDOCYANINE GREEN DYE;  Surgeon: Sebastian Ache, MD;  Location: [facility];  Service: Urology;  Laterality: N/A;   VASECTOMY      reports that he quit smoking about 41 years ago. His smoking use included cigarettes. He has never used smokeless tobacco. He reports that he does not drink alcohol and does not use drugs. family history includes Alcohol abuse in his brother and brother; Arthritis in his mother; Cancer in his father; Dementia in his mother; Heart disease (age of onset: 12) in his father; Hyperlipidemia in his father; Hypertension in his father and mother. No Known Allergies   Review of Systems  Constitutional:  Negative for chills, fever, malaise/fatigue and weight loss.  HENT:  Negative for hearing loss.   Eyes:  Negative for blurred vision and double vision.  Respiratory:  Negative for cough and shortness of breath.   Cardiovascular:  Negative for chest pain, palpitations and leg swelling.  Gastrointestinal:  Negative for abdominal pain, blood in stool, constipation and diarrhea.  Genitourinary:  Negative for dysuria.  Skin:  Negative for rash.  Neurological:  Negative for dizziness, speech change, seizures, loss of consciousness and headaches.  Psychiatric/Behavioral:  Negative for depression.  Objective:     BP 120/72 (BP Location: Left Arm, Patient Position: Sitting, Cuff Size: Large)   Pulse 77   Temp 97.6 F (36.4 C) (Oral)   Ht 6' 0.05" (1.83 m)   Wt 151 lb 3.2 oz (68.6 kg)   SpO2 95%   BMI 20.48 kg/m  BP Readings from Last 3 Encounters:  01/29/23 120/72  01/16/22 110/70  01/11/21 120/62   Wt Readings from Last 3 Encounters:  01/29/23 151 lb 3.2 oz (68.6 kg)  01/16/22 145 lb 8 oz  (66 kg)  01/11/21 148 lb 9.6 oz (67.4 kg)      Physical Exam Vitals reviewed.  Constitutional:      General: He is not in acute distress.    Appearance: He is well-developed.  HENT:     Head: Normocephalic and atraumatic.     Right Ear: External ear normal.     Left Ear: External ear normal.  Eyes:     Conjunctiva/sclera: Conjunctivae normal.     Pupils: Pupils are equal, round, and reactive to light.  Neck:     Thyroid: No thyromegaly.  Cardiovascular:     Rate and Rhythm: Normal rate and regular rhythm.     Heart sounds: Normal heart sounds. No murmur heard. Pulmonary:     Effort: No respiratory distress.     Breath sounds: No wheezing or rales.  Abdominal:     General: Bowel sounds are normal. There is no distension.     Palpations: Abdomen is soft. There is no mass.     Tenderness: There is no abdominal tenderness. There is no guarding or rebound.  Musculoskeletal:     Cervical back: Normal range of motion and neck supple.     Right lower leg: No edema.     Left lower leg: No edema.  Lymphadenopathy:     Cervical: No cervical adenopathy.  Skin:    Findings: No rash.  Neurological:     Mental Status: He is alert and oriented to person, place, and time.     Cranial Nerves: No cranial nerve deficit.      No results found for any visits on 01/29/23.    The 10-year ASCVD risk score (Arnett DK, et al., 2019) is: 12.3%    Assessment & Plan:   Physical exam.  68 year old male with remote history of prostate cancer.  Generally doing well.  We discussed the following health maintenance items  -Obtain follow-up labs.  We are not checking PSA since this is done annually through urology.  Patient is requesting A1c. -Recommend tetanus booster and patient consents -We did recommend he consider pneumonia vaccine and Shingrix but he declines -Try establish more consistent exercise. -Colonoscopy due next year   No follow-ups on file.    Evelena Peat, MD

## 2023-05-22 DIAGNOSIS — L821 Other seborrheic keratosis: Secondary | ICD-10-CM | POA: Diagnosis not present

## 2023-05-22 DIAGNOSIS — D2372 Other benign neoplasm of skin of left lower limb, including hip: Secondary | ICD-10-CM | POA: Diagnosis not present

## 2023-05-22 DIAGNOSIS — L814 Other melanin hyperpigmentation: Secondary | ICD-10-CM | POA: Diagnosis not present

## 2023-05-22 DIAGNOSIS — L819 Disorder of pigmentation, unspecified: Secondary | ICD-10-CM | POA: Diagnosis not present

## 2023-06-04 DIAGNOSIS — N5201 Erectile dysfunction due to arterial insufficiency: Secondary | ICD-10-CM | POA: Diagnosis not present

## 2023-06-04 DIAGNOSIS — C61 Malignant neoplasm of prostate: Secondary | ICD-10-CM | POA: Diagnosis not present

## 2023-06-04 DIAGNOSIS — N3946 Mixed incontinence: Secondary | ICD-10-CM | POA: Diagnosis not present

## 2023-07-17 ENCOUNTER — Telehealth: Payer: Self-pay

## 2023-07-17 DIAGNOSIS — E7800 Pure hypercholesterolemia, unspecified: Secondary | ICD-10-CM

## 2023-07-17 NOTE — Telephone Encounter (Signed)
 Copied from CRM [id number]. Topic: Clinical - Lab/Test Results >> Jul 17, 2023  8:54 AM Mercedes MATSU wrote: Reason for CRM: Patient is requesting a lab order for his cholesterol. Patient would like a phone call once labs are ordered so he can schedule his lab appt.

## 2023-07-18 ENCOUNTER — Encounter: Payer: Self-pay | Admitting: Family Medicine

## 2023-07-19 NOTE — Addendum Note (Signed)
 Addended by: METTA KRISTEN CROME on: 07/19/2023 08:23 AM   Modules accepted: Orders

## 2023-07-19 NOTE — Telephone Encounter (Signed)
 Labs placed and patient aware via Mychart message

## 2023-07-25 ENCOUNTER — Other Ambulatory Visit

## 2023-07-25 ENCOUNTER — Ambulatory Visit: Payer: Self-pay | Admitting: Family Medicine

## 2023-07-25 DIAGNOSIS — E7800 Pure hypercholesterolemia, unspecified: Secondary | ICD-10-CM

## 2023-07-25 LAB — LIPID PANEL
Cholesterol: 201 mg/dL — ABNORMAL HIGH (ref 0–200)
HDL: 61.9 mg/dL (ref >39.00)
LDL Cholesterol: 108 mg/dL — ABNORMAL HIGH (ref 0–99)
NonHDL: 138.62
Total CHOL/HDL Ratio: 3
Triglycerides: 154 mg/dL — ABNORMAL HIGH (ref 0.0–149.0)
VLDL: 30.8 mg/dL (ref 0.0–40.0)

## 2023-08-21 DIAGNOSIS — H52223 Regular astigmatism, bilateral: Secondary | ICD-10-CM | POA: Diagnosis not present

## 2023-08-21 DIAGNOSIS — H524 Presbyopia: Secondary | ICD-10-CM | POA: Diagnosis not present

## 2023-08-21 DIAGNOSIS — D3132 Benign neoplasm of left choroid: Secondary | ICD-10-CM | POA: Diagnosis not present

## 2023-08-21 DIAGNOSIS — H25813 Combined forms of age-related cataract, bilateral: Secondary | ICD-10-CM | POA: Diagnosis not present

## 2023-08-21 DIAGNOSIS — H0288B Meibomian gland dysfunction left eye, upper and lower eyelids: Secondary | ICD-10-CM | POA: Diagnosis not present

## 2023-08-21 DIAGNOSIS — H43813 Vitreous degeneration, bilateral: Secondary | ICD-10-CM | POA: Diagnosis not present
